# Patient Record
Sex: Female | Born: 1985 | Race: Black or African American | Hispanic: No | Marital: Single | Smoking: Never smoker
Health system: Southern US, Community
[De-identification: ages and names within clinical notes are randomized; demographics above are authoritative.]

---

## 2010-12-19 ENCOUNTER — Emergency Department (HOSPITAL_BASED_OUTPATIENT_CLINIC_OR_DEPARTMENT_OTHER)
Admission: EM | Admit: 2010-12-19 | Discharge: 2010-12-20 | Payer: Self-pay | Source: Home / Self Care | Admitting: Emergency Medicine

## 2010-12-23 LAB — URINALYSIS, ROUTINE W REFLEX MICROSCOPIC
Bilirubin Urine: NEGATIVE
Hgb urine dipstick: NEGATIVE
Ketones, ur: NEGATIVE mg/dL
Nitrite: NEGATIVE
Protein, ur: NEGATIVE mg/dL
Specific Gravity, Urine: 1.036 — ABNORMAL HIGH (ref 1.005–1.030)
Urine Glucose, Fasting: NEGATIVE mg/dL
Urobilinogen, UA: 1 mg/dL (ref 0.0–1.0)
pH: 6 (ref 5.0–8.0)

## 2010-12-23 LAB — WET PREP, GENITAL: Trich, Wet Prep: NONE SEEN

## 2010-12-23 LAB — PREGNANCY, URINE: Preg Test, Ur: NEGATIVE

## 2010-12-23 LAB — GC/CHLAMYDIA PROBE AMP, GENITAL
Chlamydia, DNA Probe: NEGATIVE
GC Probe Amp, Genital: NEGATIVE

## 2012-04-17 ENCOUNTER — Emergency Department (HOSPITAL_BASED_OUTPATIENT_CLINIC_OR_DEPARTMENT_OTHER)
Admission: EM | Admit: 2012-04-17 | Discharge: 2012-04-17 | Disposition: A | Discharge status: Home / Self Care | Payer: Self-pay | Attending: Emergency Medicine | Admitting: Emergency Medicine

## 2012-04-17 ENCOUNTER — Encounter (HOSPITAL_BASED_OUTPATIENT_CLINIC_OR_DEPARTMENT_OTHER): Payer: Self-pay | Admitting: *Deleted

## 2012-04-17 DIAGNOSIS — L298 Other pruritus: Secondary | ICD-10-CM | POA: Insufficient documentation

## 2012-04-17 DIAGNOSIS — L509 Urticaria, unspecified: Secondary | ICD-10-CM | POA: Insufficient documentation

## 2012-04-17 DIAGNOSIS — R21 Rash and other nonspecific skin eruption: Secondary | ICD-10-CM | POA: Insufficient documentation

## 2012-04-17 DIAGNOSIS — W57XXXA Bitten or stung by nonvenomous insect and other nonvenomous arthropods, initial encounter: Secondary | ICD-10-CM | POA: Insufficient documentation

## 2012-04-17 DIAGNOSIS — T148 Other injury of unspecified body region: Secondary | ICD-10-CM | POA: Insufficient documentation

## 2012-04-17 DIAGNOSIS — L2989 Other pruritus: Secondary | ICD-10-CM | POA: Insufficient documentation

## 2012-04-17 MED ORDER — PREDNISONE 20 MG PO TABS: 40.0000 mg | ORAL_TABLET | Freq: Every day | ORAL | Status: AC

## 2012-04-17 MED ORDER — PREDNISONE 50 MG PO TABS
60.0000 mg | ORAL_TABLET | Freq: Once | ORAL | Status: AC
  Administered 2012-04-17: 60 mg via ORAL
  Filled 2012-04-17: qty 1

## 2012-04-17 NOTE — Discharge Instructions (Signed)
Bedbugs  Bedbugs are tiny bugs that live in and around beds. They come out at night and bite people lying in bed. Bedbug bites rarely cause a medical problem. The bites do cause red, itchy bumps.  HOME CARE   Only take medicine as told by your doctor.    Wear pajamas with long sleeves and pant legs.    Call a pest control expert. You may need to throw away your mattress. Ask the pest control expert what you can do to keep the bedbugs from coming back. You may need to:    Put a plastic cover over your mattress.    Wash your clothes and bedding in hot water. Dry them in a hot dryer. The temperature should be hotter than 120 F (48.9 C).    Vacuum all around your bed often.    Check all used furniture, bedding, or clothes for bedbugs before you bring them into your house.    Remove bird nests and bat perches around your home.    After you travel, check your clothes and luggage for bedbugs before you bring them into your house. If you find any bedbugs, throw those items away.   GET HELP RIGHT AWAY IF:   You have a fever.    You have red bug bites that keep coming back.    You have red bug bites that itch badly.    You have bug bites that cause a skin rash.    You have scratch marks that are red and sore.   MAKE SURE YOU:   Understand these instructions.    Will watch your condition.    Will get help right away if you are not doing well or get worse.   Document Released: 03/11/2011 Document Revised: 11/13/2011 Document Reviewed: 03/11/2011  ExitCare Patient Information 2012 [employer].

## 2012-04-17 NOTE — ED Provider Notes (Signed)
History     CSN: [id number]  Arrival date & time 04/17/12  0156   First MD Initiated Contact with Patient 04/17/12 0226      Chief Complaint  Patient presents with  . Rash    (Consider location/radiation/quality/duration/timing/severity/associated sxs/prior treatment) Patient is a 26 y.o. female presenting with rash. The history is provided by the patient.  Rash  This is a new problem. Episode onset: 2 weeks. The problem has been gradually worsening. Associated with: new mattress. There has been no fever. The rash is present on the torso, left arm and right arm. The pain is at a severity of 0/10. The patient is experiencing no pain. The pain has been constant since onset. Associated symptoms include itching. She has tried anti-itch cream for the symptoms. The treatment provided no relief. Risk factors include new environmental exposures.    History reviewed. No pertinent past medical history.  History reviewed. No pertinent past surgical history.  No family history on file.  History  Substance Use Topics  . Smoking status: Never Smoker   . Smokeless tobacco: Not on file  . Alcohol Use: No    OB History    Grav Para Term Preterm Abortions TAB SAB Ect Mult Living                  Review of Systems  Constitutional: Negative for fever.  Skin: Positive for itching and rash.  All other systems reviewed and are negative.    Allergies  Review of patient's allergies indicates no known allergies.  Home Medications  No current outpatient prescriptions on file.  BP 138/94  Pulse 107  Temp(Src) 98 F (36.7 C) (Oral)  Resp 20  SpO2 100%  Physical Exam  Constitutional: She appears well-developed and well-nourished. No distress.  HENT:  Head: Normocephalic and atraumatic.  Eyes: EOM are normal. Pupils are equal, round, and reactive to light.  Cardiovascular: Normal rate, regular rhythm and normal heart sounds.   Pulmonary/Chest: Effort normal and breath sounds normal.  She has no wheezes. She has no rales.  Skin: Skin is warm and dry. Lesion and rash noted. Rash is maculopapular and urticarial. Rash is not pustular and not vesicular.          Excoriated lesions and the urticarial regions in the areas expressed in the picture  Psychiatric: She has a normal mood and affect. Her behavior is normal.    ED Course  Procedures (including critical care time)  Labs Reviewed - No data to display No results found.   1. Bedbug bite   2. Urticaria       MDM   Patient with lesions most consistent with that but bites. She recently got a new mattress flea market which had been used. This is most likely the source of her symptoms. Also tonight she started to itch diffusely and has urticarial lesions. She denies any respiratory involvement and is in no acute distress on exam. Discussed with her ways to Remicade bedbugs. Patient was given prednisone to help with the diffuse pruritus        Gwyneth Sprout, MD 04/17/12 [phone]

## 2012-04-17 NOTE — ED Notes (Signed)
Patient states that she has been itching with raised bumps for the past two weeks. Tonight itching and rash increased. Daughter was treated recently for a fungal infection that started around the same time, and is still on medication.

## 2013-03-21 ENCOUNTER — Encounter (HOSPITAL_BASED_OUTPATIENT_CLINIC_OR_DEPARTMENT_OTHER): Payer: Self-pay

## 2013-03-21 ENCOUNTER — Emergency Department (HOSPITAL_BASED_OUTPATIENT_CLINIC_OR_DEPARTMENT_OTHER)
Admission: EM | Admit: 2013-03-21 | Discharge: 2013-03-21 | Disposition: A | Discharge status: Home / Self Care | Payer: Self-pay | Attending: Emergency Medicine | Admitting: Emergency Medicine

## 2013-03-21 DIAGNOSIS — N898 Other specified noninflammatory disorders of vagina: Secondary | ICD-10-CM | POA: Insufficient documentation

## 2013-03-21 DIAGNOSIS — R51 Headache: Secondary | ICD-10-CM | POA: Insufficient documentation

## 2013-03-21 DIAGNOSIS — R6884 Jaw pain: Secondary | ICD-10-CM | POA: Insufficient documentation

## 2013-03-21 DIAGNOSIS — K089 Disorder of teeth and supporting structures, unspecified: Secondary | ICD-10-CM | POA: Insufficient documentation

## 2013-03-21 DIAGNOSIS — B9689 Other specified bacterial agents as the cause of diseases classified elsewhere: Secondary | ICD-10-CM | POA: Insufficient documentation

## 2013-03-21 DIAGNOSIS — Z3202 Encounter for pregnancy test, result negative: Secondary | ICD-10-CM | POA: Insufficient documentation

## 2013-03-21 DIAGNOSIS — N760 Acute vaginitis: Secondary | ICD-10-CM | POA: Insufficient documentation

## 2013-03-21 DIAGNOSIS — A499 Bacterial infection, unspecified: Secondary | ICD-10-CM | POA: Insufficient documentation

## 2013-03-21 DIAGNOSIS — K0889 Other specified disorders of teeth and supporting structures: Secondary | ICD-10-CM

## 2013-03-21 LAB — URINALYSIS, ROUTINE W REFLEX MICROSCOPIC
Bilirubin Urine: NEGATIVE
Glucose, UA: NEGATIVE mg/dL
Hgb urine dipstick: NEGATIVE
Ketones, ur: NEGATIVE mg/dL
Nitrite: NEGATIVE
Protein, ur: NEGATIVE mg/dL
Specific Gravity, Urine: 1.029 (ref 1.005–1.030)
Urobilinogen, UA: 0.2 mg/dL (ref 0.0–1.0)
pH: 6 (ref 5.0–8.0)

## 2013-03-21 LAB — WET PREP, GENITAL
Trich, Wet Prep: NONE SEEN
Yeast Wet Prep HPF POC: NONE SEEN

## 2013-03-21 LAB — URINE MICROSCOPIC-ADD ON

## 2013-03-21 LAB — PREGNANCY, URINE: Preg Test, Ur: NEGATIVE

## 2013-03-21 MED ORDER — HYDROCODONE-ACETAMINOPHEN 5-325 MG PO TABS: 2.0000 | ORAL_TABLET | ORAL | Status: DC | PRN

## 2013-03-21 MED ORDER — METRONIDAZOLE 500 MG PO TABS: 500.0000 mg | ORAL_TABLET | Freq: Two times a day (BID) | ORAL | Status: DC

## 2013-03-21 MED ORDER — OXYCODONE-ACETAMINOPHEN 5-325 MG PO TABS
2.0000 | ORAL_TABLET | Freq: Once | ORAL | Status: DC
  Filled 2013-03-21 (×2): qty 2

## 2013-03-21 MED ORDER — AZITHROMYCIN 250 MG PO TABS
1000.0000 mg | ORAL_TABLET | Freq: Once | ORAL | Status: AC
  Administered 2013-03-21: 1000 mg via ORAL
  Filled 2013-03-21: qty 4

## 2013-03-21 MED ORDER — PENICILLIN V POTASSIUM 500 MG PO TABS: 500.0000 mg | ORAL_TABLET | Freq: Three times a day (TID) | ORAL | Status: DC

## 2013-03-21 MED ORDER — LIDOCAINE HCL (PF) 1 % IJ SOLN
INTRAMUSCULAR | Status: AC
  Filled 2013-03-21: qty 5

## 2013-03-21 MED ORDER — CEFTRIAXONE SODIUM 250 MG IJ SOLR
250.0000 mg | Freq: Once | INTRAMUSCULAR | Status: AC
  Administered 2013-03-21: 250 mg via INTRAMUSCULAR
  Filled 2013-03-21: qty 250

## 2013-03-21 MED ORDER — ACETAMINOPHEN 500 MG PO TABS
1000.0000 mg | ORAL_TABLET | Freq: Once | ORAL | Status: DC
  Filled 2013-03-21: qty 2

## 2013-03-21 NOTE — ED Provider Notes (Signed)
History     CSN: [id number]  Arrival date & time 03/21/13  1722   First MD Initiated Contact with Patient 03/21/13 1737      Chief Complaint  Patient presents with  . Dental Pain  . Headache  . Vaginal Discharge    (Consider location/radiation/quality/duration/timing/severity/associated sxs/prior treatment) HPI Comments: The patient is a 27 year old otherwise healthy female who presents with dental pain that started gradually one week ago. The dental pain is severe, constant and progressively worsening. The pain is aching and located in right lower jaw. The pain does not radiate. Eating makes the pain worse. Nothing makes the pain better. The patient has tried tylenol for pain with some relief. Patient reports associated headache. Patient denies neck pain/stiffness, fever, NVD, edema, sore throat, throat swelling, wheezing, SOB, chest pain, abdominal pain.   Patient reports vaginal discharged for one week also. No associated symptoms. Symptoms started gradually and progressively worsened since the onset. No aggravating/alleviating factors.   Patient is a 27 y.o. female presenting with tooth pain, headaches, and vaginal discharge.  Dental PainThe primary symptoms include headaches.   Headache Vaginal Discharge Associated symptoms include headaches.    History reviewed. No pertinent past medical history.  History reviewed. No pertinent past surgical history.  No family history on file.  History  Substance Use Topics  . Smoking status: Never Smoker   . Smokeless tobacco: Not on file  . Alcohol Use: Yes     Comment: occasionally    OB History   Grav Para Term Preterm Abortions TAB SAB Ect Mult Living                  Review of Systems  HENT: Positive for dental problem.   Genitourinary: Positive for vaginal discharge.  Neurological: Positive for headaches.  All other systems reviewed and are negative.    Allergies  Review of patient's allergies indicates no known  allergies.  Home Medications  No current outpatient prescriptions on file.  BP 131/88  Pulse 91  Temp(Src) 99.4 F (37.4 C) (Oral)  Resp 20  Wt 217 lb (98.431 kg)  SpO2 100%  Physical Exam  Nursing note and vitals reviewed. Constitutional: She is oriented to person, place, and time. She appears well-developed and well-nourished. No distress.  HENT:  Head: Normocephalic and atraumatic.  Eyes: Conjunctivae are normal.  Neck: Normal range of motion.  Cardiovascular: Normal rate and regular rhythm.  Exam reveals no gallop and no friction rub.   No murmur heard. Pulmonary/Chest: Effort normal and breath sounds normal. She has no wheezes. She has no rales. She exhibits no tenderness.  Abdominal: Soft. She exhibits no distension. There is no tenderness. There is no rebound and no guarding.  Genitourinary:  Copious, white thick vaginal discharge. Cervical os closed. No CMT.   Musculoskeletal: Normal range of motion.  Neurological: She is alert and oriented to person, place, and time. Coordination normal.  Speech is goal-oriented. Moves limbs without ataxia.   Skin: Skin is warm and dry.  Psychiatric: She has a normal mood and affect. Her behavior is normal.    ED Course  Procedures (including critical care time)  Labs Reviewed  WET PREP, GENITAL - Abnormal; Notable for the following:    Clue Cells Wet Prep HPF POC MANY (*)    WBC, Wet Prep HPF POC MODERATE (*)    All other components within normal limits  URINALYSIS, ROUTINE W REFLEX MICROSCOPIC - Abnormal; Notable for the following:    APPearance  CLOUDY (*)    Leukocytes, UA TRACE (*)    All other components within normal limits  URINE MICROSCOPIC-ADD ON - Abnormal; Notable for the following:    Squamous Epithelial / LPF FEW (*)    Bacteria, UA FEW (*)    All other components within normal limits  URINE CULTURE  GC/CHLAMYDIA PROBE AMP  PREGNANCY, URINE   No results found.   1. BV (bacterial vaginosis)   2. Pain,  dental       MDM  6:58 PM Urinalysis and urine pregnancy unremarkable. Wet prep shows moderate WBC and many clue cells. I will treat her for BV and GC/Chlamydia. Patient afebrile with stable vitals. Patient will be contacted with positive GC/Chlamydia results within 48 hours if positive. Patient will have vicodin for dental pain and pen VK for possible infection. Patient instructed to follow up with dentist-she shoulder call within 48 hours to guarantee a visit, as explained to the patient.        Emilia Beck, [place] 03/21/13 1912

## 2013-03-21 NOTE — ED Provider Notes (Signed)
Medical screening examination/treatment/procedure(s) were performed by non-physician practitioner and as supervising physician I was immediately available for consultation/collaboration.   Charles B. Sheldon, MD 03/21/13 2003 

## 2013-03-21 NOTE — ED Notes (Addendum)
Per patient, she states that a week ago she started having dental pain on right side, upper teeth and now has a severe headache from the pain. Pt states she took tylenol for the pain with some relief. Hx of cavities.

## 2013-03-21 NOTE — ED Notes (Signed)
Pt states she has had nausea for one week which is associated with vaginal discharge.

## 2013-03-22 LAB — URINE CULTURE: Special Requests: NORMAL

## 2013-03-22 LAB — GC/CHLAMYDIA PROBE AMP
CT Probe RNA: NEGATIVE
GC Probe RNA: NEGATIVE

## 2014-11-01 ENCOUNTER — Emergency Department (HOSPITAL_BASED_OUTPATIENT_CLINIC_OR_DEPARTMENT_OTHER)
Admission: EM | Admit: 2014-11-01 | Discharge: 2014-11-01 | Disposition: A | Discharge status: Home / Self Care | Payer: Self-pay | Attending: Emergency Medicine | Admitting: Emergency Medicine

## 2014-11-01 ENCOUNTER — Encounter (HOSPITAL_BASED_OUTPATIENT_CLINIC_OR_DEPARTMENT_OTHER): Payer: Self-pay | Admitting: Emergency Medicine

## 2014-11-01 DIAGNOSIS — Z3202 Encounter for pregnancy test, result negative: Secondary | ICD-10-CM | POA: Insufficient documentation

## 2014-11-01 DIAGNOSIS — Z792 Long term (current) use of antibiotics: Secondary | ICD-10-CM | POA: Insufficient documentation

## 2014-11-01 DIAGNOSIS — B373 Candidiasis of vulva and vagina: Secondary | ICD-10-CM | POA: Insufficient documentation

## 2014-11-01 DIAGNOSIS — Z79899 Other long term (current) drug therapy: Secondary | ICD-10-CM | POA: Insufficient documentation

## 2014-11-01 DIAGNOSIS — B3731 Acute candidiasis of vulva and vagina: Secondary | ICD-10-CM

## 2014-11-01 LAB — URINALYSIS, ROUTINE W REFLEX MICROSCOPIC
Bilirubin Urine: NEGATIVE
Glucose, UA: NEGATIVE mg/dL
Hgb urine dipstick: NEGATIVE
Ketones, ur: NEGATIVE mg/dL
Leukocytes, UA: NEGATIVE
Nitrite: NEGATIVE
Protein, ur: NEGATIVE mg/dL
Specific Gravity, Urine: 1.035 — ABNORMAL HIGH (ref 1.005–1.030)
Urobilinogen, UA: 0.2 mg/dL (ref 0.0–1.0)
pH: 5.5 (ref 5.0–8.0)

## 2014-11-01 LAB — WET PREP, GENITAL: Trich, Wet Prep: NONE SEEN

## 2014-11-01 LAB — PREGNANCY, URINE: Preg Test, Ur: NEGATIVE

## 2014-11-01 MED ORDER — FLUCONAZOLE 150 MG PO TABS: 150.0000 mg | ORAL_TABLET | Freq: Every day | ORAL | Status: DC

## 2014-11-01 NOTE — Discharge Instructions (Signed)
Diflucan as prescribed.  Return to the emergency department if you develop severe abdominal pain, high fever, or other new and concerning symptoms.  We will call you if your cultures indicate that you require additional treatment.   Vaginitis Vaginitis is an inflammation of the vagina. It is most often caused by a change in the normal balance of the bacteria and yeast that live in the vagina. This change in balance causes an overgrowth of certain bacteria or yeast, which causes the inflammation. There are different types of vaginitis, but the most common types are:  Bacterial vaginosis.  Yeast infection (candidiasis).  Trichomoniasis vaginitis. This is a sexually transmitted infection (STI).  Viral vaginitis.  Atropic vaginitis.  Allergic vaginitis. CAUSES  The cause depends on the type of vaginitis. Vaginitis can be caused by:  Bacteria (bacterial vaginosis).  Yeast (yeast infection).  A parasite (trichomoniasis vaginitis)  A virus (viral vaginitis).  Low hormone levels (atrophic vaginitis). Low hormone levels can occur during pregnancy, breastfeeding, or after menopause.  Irritants, such as bubble baths, scented tampons, and feminine sprays (allergic vaginitis). Other factors can change the normal balance of the yeast and bacteria that live in the vagina. These include:  Antibiotic medicines.  Poor hygiene.  Diaphragms, vaginal sponges, spermicides, birth control pills, and intrauterine devices (IUD).  Sexual intercourse.  Infection.  Uncontrolled diabetes.  A weakened immune system. SYMPTOMS  Symptoms can vary depending on the cause of the vaginitis. Common symptoms include:  Abnormal vaginal discharge.  The discharge is white, gray, or yellow with bacterial vaginosis.  The discharge is thick, white, and cheesy with a yeast infection.  The discharge is frothy and yellow or greenish with trichomoniasis.  A bad vaginal odor.  The odor is fishy with  bacterial vaginosis.  Vaginal itching, pain, or swelling.  Painful intercourse.  Pain or burning when urinating. Sometimes, there are no symptoms. TREATMENT  Treatment will vary depending on the type of infection.   Bacterial vaginosis and trichomoniasis are often treated with antibiotic creams or pills.  Yeast infections are often treated with antifungal medicines, such as vaginal creams or suppositories.  Viral vaginitis has no cure, but symptoms can be treated with medicines that relieve discomfort. Your sexual partner should be treated as well.  Atrophic vaginitis may be treated with an estrogen cream, pill, suppository, or vaginal ring. If vaginal dryness occurs, lubricants and moisturizing creams may help. You may be told to avoid scented soaps, sprays, or douches.  Allergic vaginitis treatment involves quitting the use of the product that is causing the problem. Vaginal creams can be used to treat the symptoms. HOME CARE INSTRUCTIONS   Take all medicines as directed by your caregiver.  Keep your genital area clean and dry. Avoid soap and only rinse the area with water.  Avoid douching. It can remove the healthy bacteria in the vagina.  Do not use tampons or have sexual intercourse until your vaginitis has been treated. Use sanitary pads while you have vaginitis.  Wipe from front to back. This avoids the spread of bacteria from the rectum to the vagina.  Let air reach your genital area.  Wear cotton underwear to decrease moisture buildup.  Avoid wearing underwear while you sleep until your vaginitis is gone.  Avoid tight pants and underwear or nylons without a cotton panel.  Take off wet clothing (especially bathing suits) as soon as possible.  Use mild, non-scented products. Avoid using irritants, such as:  Scented feminine sprays.  Fabric softeners.  Scented  detergents.  Scented tampons.  Scented soaps or bubble baths.  Practice safe sex and use condoms.  Condoms may prevent the spread of trichomoniasis and viral vaginitis. SEEK MEDICAL CARE IF:   You have abdominal pain.  You have a fever or persistent symptoms for more than 2-3 days.  You have a fever and your symptoms suddenly get worse. Document Released: 09/21/2007 Document Revised: 08/18/2012 Document Reviewed: 05/06/2012 [facility] Patient Information 2015 [place]Care, [place]. This information is not intended to replace advice given to you by your health care provider. Make sure you discuss any questions you have with your health care provider.

## 2014-11-01 NOTE — ED Notes (Signed)
Pt reports that she developed some lower abdominal cramping with no bleeding, started to notice discharge and fowl odor on Sunday that is getting progressively worse

## 2014-11-01 NOTE — ED Provider Notes (Signed)
CSN: [id number]     Arrival date & time 11/01/14  0147 History   First MD Initiated Contact with Patient 11/01/14 0207     Chief Complaint  Patient presents with  . Vaginal Discharge     (Consider location/radiation/quality/duration/timing/severity/associated sxs/prior Treatment) HPI Comments: Patient is a 28 year old female who presents with complaints of lower abdominal cramping and vaginal discharge for the past week. She states it is white and has a foul odor. She denies any bleeding. She denies any new sexual contacts. Her last menstrual period was 3 weeks ago and normal.  Patient is a 28 y.o. female presenting with vaginal discharge. The history is provided by the patient.  Vaginal Discharge Quality:  White Severity:  Moderate Onset quality:  Gradual Duration:  1 week Timing:  Constant Progression:  Worsening Chronicity:  New Relieved by:  Nothing Worsened by:  Nothing tried Ineffective treatments:  None tried Associated symptoms: abdominal pain   Associated symptoms: no dyspareunia, no nausea and no urinary frequency   Risk factors: no new sexual partner     History reviewed. No pertinent past medical history. History reviewed. No pertinent past surgical history. History reviewed. No pertinent family history. History  Substance Use Topics  . Smoking status: Never Smoker   . Smokeless tobacco: Not on file  . Alcohol Use: Yes     Comment: occasionally   OB History    No data available     Review of Systems  Gastrointestinal: Positive for abdominal pain. Negative for nausea.  Genitourinary: Positive for vaginal discharge. Negative for dyspareunia.  All other systems reviewed and are negative.     Allergies  Review of patient's allergies indicates no known allergies.  Home Medications   Prior to Admission medications   Medication Sig Start Date End Date Taking? Authorizing Provider  HYDROcodone-acetaminophen (NORCO/VICODIN) 5-325 MG per tablet Take 2  tablets by mouth every 4 (four) hours as needed for pain. 03/21/13   Kaitlyn Szekalski, PA-C  metroNIDAZOLE (FLAGYL) 500 MG tablet Take 1 tablet (500 mg total) by mouth 2 (two) times daily. 03/21/13   Kaitlyn Szekalski, PA-C  penicillin v potassium (VEETID) 500 MG tablet Take 1 tablet (500 mg total) by mouth 3 (three) times daily. 03/21/13   Kaitlyn Szekalski, PA-C   BP 147/95 mmHg  Pulse 106  Temp(Src) 98.6 F (37 C) (Oral)  Resp 18  Ht 5\' 8"  (1.727 m)  Wt 220 lb (99.791 kg)  BMI 33.46 kg/m2  SpO2 100%  LMP 10/06/2014 Physical Exam  Constitutional: She is oriented to person, place, and time. She appears well-developed and well-nourished. No distress.  HENT:  Head: Normocephalic and atraumatic.  Neck: Normal range of motion. Neck supple.  Abdominal: Soft. Bowel sounds are normal. She exhibits no distension. There is tenderness.  There is mild tenderness to palpation in the suprapubic region with no rebound or guarding.  Genitourinary: Vaginal discharge found.  Areas a slight white vaginal discharge present. There is no adnexal tenderness or masses. There is no cervical motion tenderness.  Musculoskeletal: Normal range of motion.  Neurological: She is alert and oriented to person, place, and time.  Skin: Skin is warm and dry. She is not diaphoretic.  Nursing note and vitals reviewed.   ED Course  Procedures (including critical care time) Labs Review Labs Reviewed  URINALYSIS, ROUTINE W REFLEX MICROSCOPIC - Abnormal; Notable for the following:    APPearance CLOUDY (*)    Specific Gravity, Urine 1.035 (*)    All other components within  normal limits  WET PREP, GENITAL  GC/CHLAMYDIA PROBE AMP  PREGNANCY, URINE    Imaging Review No results found.   EKG Interpretation None      MDM   Final diagnoses:  None    Patient presents with complaints of vaginal discharge. Workup reveals a clear urinalysis and negative pregnancy test. Wet prep reveals many yeast. She will be  treated with Diflucan. GC and Chlamydia cultures are pending. If these become positive, she will be notified and treated accordingly.    Geoffery Lyonsouglas Jejuan Scala, MD 11/01/14 [phone]

## 2014-11-02 LAB — GC/CHLAMYDIA PROBE AMP
CT Probe RNA: NEGATIVE
GC Probe RNA: NEGATIVE

## 2015-02-01 ENCOUNTER — Encounter (HOSPITAL_BASED_OUTPATIENT_CLINIC_OR_DEPARTMENT_OTHER): Payer: Self-pay | Admitting: Emergency Medicine

## 2015-02-01 ENCOUNTER — Emergency Department (HOSPITAL_BASED_OUTPATIENT_CLINIC_OR_DEPARTMENT_OTHER): Payer: Self-pay

## 2015-02-01 ENCOUNTER — Emergency Department (HOSPITAL_BASED_OUTPATIENT_CLINIC_OR_DEPARTMENT_OTHER)
Admission: EM | Admit: 2015-02-01 | Discharge: 2015-02-02 | Disposition: A | Discharge status: Home / Self Care | Payer: Self-pay | Attending: Emergency Medicine | Admitting: Emergency Medicine

## 2015-02-01 DIAGNOSIS — R0609 Other forms of dyspnea: Secondary | ICD-10-CM | POA: Insufficient documentation

## 2015-02-01 DIAGNOSIS — Z792 Long term (current) use of antibiotics: Secondary | ICD-10-CM | POA: Insufficient documentation

## 2015-02-01 DIAGNOSIS — Z79899 Other long term (current) drug therapy: Secondary | ICD-10-CM | POA: Insufficient documentation

## 2015-02-01 DIAGNOSIS — Z3202 Encounter for pregnancy test, result negative: Secondary | ICD-10-CM | POA: Insufficient documentation

## 2015-02-01 NOTE — ED Notes (Signed)
Patient is talking on her cell phone in the waiting room without an distress noted. Patient ambulated to triage room and to the waiting room without any distress.

## 2015-02-01 NOTE — ED Notes (Signed)
Patient states that she has not been breathing normal for the last week "I am just not getting enough air"

## 2015-02-02 LAB — CBC WITH DIFFERENTIAL/PLATELET
Basophils Absolute: 0 10*3/uL (ref 0.0–0.1)
Basophils Relative: 0 % (ref 0–1)
Eosinophils Absolute: 0.1 10*3/uL (ref 0.0–0.7)
Eosinophils Relative: 2 % (ref 0–5)
HCT: 37.6 % (ref 36.0–46.0)
Hemoglobin: 12.4 g/dL (ref 12.0–15.0)
Lymphocytes Relative: 42 % (ref 12–46)
Lymphs Abs: 4.1 10*3/uL — ABNORMAL HIGH (ref 0.7–4.0)
MCH: 27.1 pg (ref 26.0–34.0)
MCHC: 33 g/dL (ref 30.0–36.0)
MCV: 82.3 fL (ref 78.0–100.0)
Monocytes Absolute: 0.7 10*3/uL (ref 0.1–1.0)
Monocytes Relative: 7 % (ref 3–12)
Neutro Abs: 4.7 10*3/uL (ref 1.7–7.7)
Neutrophils Relative %: 49 % (ref 43–77)
Platelets: 323 10*3/uL (ref 150–400)
RBC: 4.57 MIL/uL (ref 3.87–5.11)
RDW: 14.9 % (ref 11.5–15.5)
WBC: 9.6 10*3/uL (ref 4.0–10.5)

## 2015-02-02 LAB — D-DIMER, QUANTITATIVE: D-Dimer, Quant: <0.27 ug/mL-FEU (ref 0.00–0.48)

## 2015-02-02 LAB — BASIC METABOLIC PANEL
Anion gap: 4 — ABNORMAL LOW (ref 5–15)
BUN: 11 mg/dL (ref 6–23)
CO2: 25 mmol/L (ref 19–32)
Calcium: 8.7 mg/dL (ref 8.4–10.5)
Chloride: 104 mmol/L (ref 96–112)
Creatinine, Ser: 0.72 mg/dL (ref 0.50–1.10)
GFR calc Af Amer: >90 mL/min (ref >90)
GFR calc non Af Amer: >90 mL/min (ref >90)
Glucose, Bld: 93 mg/dL (ref 70–99)
Potassium: 3.7 mmol/L (ref 3.5–5.1)
Sodium: 133 mmol/L — ABNORMAL LOW (ref 135–145)

## 2015-02-02 LAB — PREGNANCY, URINE: PREG TEST UR: NEGATIVE

## 2015-02-02 MED ORDER — SODIUM CHLORIDE 0.9 % IV SOLN
Freq: Once | INTRAVENOUS | Status: AC
  Administered 2015-02-02: 03:00:00 via INTRAVENOUS

## 2015-02-02 NOTE — ED Notes (Signed)
IV flds changed to kvo

## 2015-02-02 NOTE — Discharge Instructions (Signed)
We saw you in the ER for the chest pain/shortness of breath. °All of our cardiac workup is normal, including labs, EKG and chest X-RAY are normal. °We are not sure what is causing your discomfort, but we feel comfortable sending you home at this time. The workup in the ER is not complete, and you should follow up with your primary care doctor for further evaluation. ° ° °Shortness of Breath °Shortness of breath means you have trouble breathing. It could also mean that you have a medical problem. You should get immediate medical care for shortness of breath. °CAUSES  °· Not enough oxygen in the air such as with high altitudes or a smoke-filled room. °· Certain lung diseases, infections, or problems. °· Heart disease or conditions, such as angina or heart failure. °· Low red blood cells (anemia). °· Poor physical fitness, which can cause shortness of breath when you exercise. °· Chest or back injuries or stiffness. °· Being overweight. °· Smoking. °· Anxiety, which can make you feel like you are not getting enough air. °DIAGNOSIS  °Serious medical problems can often be found during your physical exam. Tests may also be done to determine why you are having shortness of breath. Tests may include: °· Chest X-rays. °· Lung function tests. °· Blood tests. °· An electrocardiogram (ECG). °· An ambulatory electrocardiogram. An ambulatory ECG records your heartbeat patterns over a 24-hour period. °· Exercise testing. °· A transthoracic echocardiogram (TTE). During echocardiography, sound waves are used to evaluate how blood flows through your heart. °· A transesophageal echocardiogram (TEE). °· Imaging scans. °Your health care provider may not be able to find a cause for your shortness of breath after your exam. In this case, it is important to have a follow-up exam with your health care provider as directed.  °TREATMENT  °Treatment for shortness of breath depends on the cause of your symptoms and can vary greatly. °HOME CARE  INSTRUCTIONS  °· Do not smoke. Smoking is a common cause of shortness of breath. If you smoke, ask for help to quit. °· Avoid being around chemicals or things that may bother your breathing, such as paint fumes and dust. °· Rest as needed. Slowly resume your usual activities. °· If medicines were prescribed, take them as directed for the full length of time directed. This includes oxygen and any inhaled medicines. °· Keep all follow-up appointments as directed by your health care provider. °SEEK MEDICAL CARE IF:  °· Your condition does not improve in the time expected. °· You have a hard time doing your normal activities even with rest. °· You have any new symptoms. °SEEK IMMEDIATE MEDICAL CARE IF:  °· Your shortness of breath gets worse. °· You feel light-headed, faint, or develop a cough not controlled with medicines. °· You start coughing up blood. °· You have pain with breathing. °· You have chest pain or pain in your arms, shoulders, or abdomen. °· You have a fever. °· You are unable to walk up stairs or exercise the way you normally do. °MAKE SURE YOU: °· Understand these instructions. °· Will watch your condition. °· Will get help right away if you are not doing well or get worse. °Document Released: 08/19/2001 Document Revised: 11/29/2013 Document Reviewed: 02/09/2012 °Exit[employer]® Patient Information ©2015 [employer]. This information is not intended to replace advice given to you by your health care provider. Make sure you discuss any questions you have with your health care provider. ° °

## 2015-02-02 NOTE — ED Provider Notes (Signed)
CSN: [id number]     Arrival date & time 02/01/15  2203 History   First MD Initiated Contact with Patient 02/02/15 0154     Chief Complaint  Patient presents with  . Shortness of Breath     (Consider location/radiation/quality/duration/timing/severity/associated sxs/prior Treatment) HPI Comments: Pt with no medical hx comes in with cc of shortness of breath. Pt has DIB with exertion x 1 week. Pt's symptoms are present when she walks from her parking lot to the work place. She doesn't get similat symptoms with minimal walking. No chest pain. Pt feels that she has trouble getting air in. No productive cough, no wheezing and no hx of lung disease.  Pt has no hx of PE, DVT and denies any exogenous estrogen use, long distance travels or surgery in the past 6 weeks, active cancer, recent immobilization.     Patient is a 29 y.o. female presenting with shortness of breath. The history is provided by the patient.  Shortness of Breath Associated symptoms: no abdominal pain, no chest pain, no headaches, no neck pain and no vomiting     History reviewed. No pertinent past medical history. History reviewed. No pertinent past surgical history. History reviewed. No pertinent family history. History  Substance Use Topics  . Smoking status: Never Smoker   . Smokeless tobacco: Not on file  . Alcohol Use: Yes     Comment: occasionally   OB History    No data available     Review of Systems  Constitutional: Positive for activity change.  Respiratory: Positive for shortness of breath.   Cardiovascular: Negative for chest pain.  Gastrointestinal: Negative for nausea, vomiting and abdominal pain.  Genitourinary: Negative for dysuria.  Musculoskeletal: Negative for neck pain.  Neurological: Negative for headaches.      Allergies  Review of patient's allergies indicates no known allergies.  Home Medications   Prior to Admission medications   Medication Sig Start Date End Date Taking?  Authorizing Provider  fluconazole (DIFLUCAN) 150 MG tablet Take 1 tablet (150 mg total) by mouth daily. 11/01/14   Geoffery Lyons, MD  HYDROcodone-acetaminophen (NORCO/VICODIN) 5-325 MG per tablet Take 2 tablets by mouth every 4 (four) hours as needed for pain. 03/21/13   Kaitlyn Szekalski, PA-C  metroNIDAZOLE (FLAGYL) 500 MG tablet Take 1 tablet (500 mg total) by mouth 2 (two) times daily. 03/21/13   Kaitlyn Szekalski, PA-C  penicillin v potassium (VEETID) 500 MG tablet Take 1 tablet (500 mg total) by mouth 3 (three) times daily. 03/21/13   Kaitlyn Szekalski, PA-C   BP 147/93 mmHg  Pulse 87  Temp(Src) 97.9 F (36.6 C) (Oral)  Resp 20  Wt 245 lb (111.131 kg)  SpO2 100%  LMP 01/07/2015 Physical Exam  Constitutional: She is oriented to person, place, and time. She appears well-developed and well-nourished.  HENT:  Head: Normocephalic and atraumatic.  Eyes: EOM are normal. Pupils are equal, round, and reactive to light.  Neck: Neck supple.  Cardiovascular: Normal rate, regular rhythm and normal heart sounds.   No murmur heard. Pulmonary/Chest: Effort normal. No respiratory distress. She has no rales.  Abdominal: Soft. She exhibits no distension. There is no tenderness. There is no rebound and no guarding.  Musculoskeletal: She exhibits no edema or tenderness.  Neurological: She is alert and oriented to person, place, and time.  Skin: Skin is warm and dry.  Nursing note and vitals reviewed.   ED Course  Procedures (including critical care time) Labs Review Labs Reviewed  CBC WITH DIFFERENTIAL/PLATELET -  Abnormal; Notable for the following:    Lymphs Abs 4.1 (*)    All other components within normal limits  BASIC METABOLIC PANEL - Abnormal; Notable for the following:    Sodium 133 (*)    Anion gap 4 (*)    All other components within normal limits  D-DIMER, QUANTITATIVE  PREGNANCY, URINE    Imaging Review Dg Chest 2 View  02/01/2015   CLINICAL DATA:  Shortness of breath for 1  week.  Worse on exertion.  EXAM: CHEST  2 VIEW  COMPARISON:  None.  FINDINGS: The heart size and mediastinal contours are within normal limits. Both lungs are clear. The visualized skeletal structures are unremarkable.  IMPRESSION: No active cardiopulmonary disease.   Electronically Signed   By: Burman NievesWilliam  Stevens M.D.   On: 02/01/2015 22:38     EKG Interpretation None      MDM   Final diagnoses:  DOE (dyspnea on exertion)    PT comes in with cc of dyspnea. She is PERC negative. CXR is clear. No signs of DVT either, however, we can't account for her symptoms, and therefore dimer was still ordered and is neg. [facility] f/u provided. Strict return precautions discussed.   Derwood KaplanAnkit Chaka Boyson, MD 02/02/15 0400

## 2015-04-27 ENCOUNTER — Ambulatory Visit (INDEPENDENT_AMBULATORY_CARE_PROVIDER_SITE_OTHER): Payer: Self-pay | Admitting: Family Medicine

## 2015-04-27 VITALS — BP 116/76 | HR 92 | Temp 99.1°F | Resp 20 | Ht 68.0 in | Wt 246.4 lb

## 2015-04-27 DIAGNOSIS — Z020 Encounter for examination for admission to educational institution: Secondary | ICD-10-CM

## 2015-04-27 DIAGNOSIS — Z0000 Encounter for general adult medical examination without abnormal findings: Secondary | ICD-10-CM

## 2015-04-27 NOTE — Progress Notes (Signed)
Subjective:    Patient ID: Caroline Harrell, female    DOB: 03/01/1986, 29 y.o.   MRN: [id number] This chart was scribed for Elvina SidleKurt Lauenstein, MD by Chestine SporeSoijett Blue, ED Scribe. The patient was seen in room 10 at 5:26 PM.   Chief Complaint  Patient presents with   Annual Exam    for nursing school    HPI   Caroline Harrell is a 29 y.o. female who presents today complaining of annual exam onset today. 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 She denies any other symptoms.  Pt works with [employer].      There are no active problems to display for this patient.  History reviewed. No pertinent past medical history. History reviewed. No pertinent past surgical history. No Known Allergies Prior to Admission medications   Medication Sig Start Date End Date Taking? Authorizing Provider  fluconazole (DIFLUCAN) 150 MG tablet Take 1 tablet (150 mg total) by mouth daily. Patient not taking: Reported on 04/27/2015 11/01/14   Geoffery Lyonsouglas Delo, MD  HYDROcodone-acetaminophen (NORCO/VICODIN) 5-325 MG per tablet Take 2 tablets by mouth every 4 (four) hours as needed for pain. Patient not taking: Reported on 04/27/2015 03/21/13   Emilia BeckKaitlyn Szekalski, PA-C  metroNIDAZOLE (FLAGYL) 500 MG tablet Take 1 tablet (500 mg total) by mouth 2 (two) times daily. Patient not taking: Reported on 04/27/2015 03/21/13   Emilia BeckKaitlyn Szekalski, PA-C  penicillin v potassium (VEETID) 500 MG tablet Take 1 tablet (500 mg total) by mouth 3 (three) times daily. Patient not taking: Reported on 04/27/2015 03/21/13   Emilia BeckKaitlyn Szekalski, PA-C      Review of Systems  Constitutional: Negative for fever and chills.  HENT: Negative for congestion and ear pain.   Eyes: Negative for pain.  Respiratory: Negative for cough and shortness of breath.   Cardiovascular: Negative for chest pain.  Gastrointestinal: Negative for nausea, vomiting, abdominal pain and diarrhea.    Musculoskeletal: Negative for myalgias, back pain, joint swelling, arthralgias, gait problem and neck pain.  Skin: Negative for rash.  Allergic/Immunologic: Negative for immunocompromised state.  Neurological: Negative for dizziness, syncope, weakness, numbness and headaches.  Hematological: Does not bruise/bleed easily.       Objective:   Physical Exam  Constitutional: She is oriented to person, place, and time. She appears well-developed and well-nourished. No distress.  HENT:  Head: Normocephalic and atraumatic.  Right Ear: Tympanic membrane, external ear and ear canal normal.  Left Ear: Tympanic membrane, external ear and ear canal normal.  Mouth/Throat: Dental caries present.  Tooth number 13 broken with the root showing.  Eyes: Conjunctivae and EOM are normal. Pupils are equal, round, and reactive to light.  Neck: Neck supple. No tracheal deviation present.  Cardiovascular: Normal rate and regular rhythm.  Exam reveals no gallop and no friction rub.   No murmur heard. Pulmonary/Chest: Effort normal and breath sounds normal. No respiratory distress. She has no wheezes. She has no rhonchi. She has no rales.  Abdominal: Soft. There is no tenderness.  Musculoskeletal: Normal range of motion.  Neurological: She is alert and oriented to person, place, and time.  Skin: Skin is warm and dry.  Psychiatric: She has a normal mood and affect. Her behavior is normal.  Nursing note and vitals reviewed.    BP 116/76 mmHg   Pulse 92   Temp(Src) 99.1 F (37.3 C) (Oral)   Resp 20   Ht 5\' 8"  (1.727 m)  Wt 246 lb 6 oz (111.755 kg)   BMI 37.47 kg/m2   SpO2 98%   LMP 04/24/2015  Assessment & Plan:  DIAGNOSTIC STUDIES: Oxygen Saturation is 98% on RA, nl by my interpretation.    COORDINATION OF CARE: 5:34 PM-Discussed treatment plan which includes TB test with pt at bedside and pt agreed to plan.   This chart was scribed in my presence and reviewed by me personally.    ICD-9-CM ICD-10-CM    1. Annual physical exam V70.0 Z00.00     Dental evaluation recommended  Signed, Elvina SidleKurt Lauenstein, MD

## 2018-04-28 ENCOUNTER — Other Ambulatory Visit: Payer: Self-pay

## 2018-04-28 ENCOUNTER — Inpatient Hospital Stay (HOSPITAL_BASED_OUTPATIENT_CLINIC_OR_DEPARTMENT_OTHER)
Admission: EM | Admit: 2018-04-28 | Discharge: 2018-04-30 | DRG: 418 | Disposition: A | Discharge status: Home / Self Care | Payer: Self-pay | Attending: Surgery | Admitting: Surgery

## 2018-04-28 ENCOUNTER — Encounter (HOSPITAL_BASED_OUTPATIENT_CLINIC_OR_DEPARTMENT_OTHER): Payer: Self-pay

## 2018-04-28 ENCOUNTER — Emergency Department (HOSPITAL_BASED_OUTPATIENT_CLINIC_OR_DEPARTMENT_OTHER): Payer: Self-pay

## 2018-04-28 DIAGNOSIS — R1010 Upper abdominal pain, unspecified: Secondary | ICD-10-CM

## 2018-04-28 DIAGNOSIS — K810 Acute cholecystitis: Secondary | ICD-10-CM | POA: Diagnosis present

## 2018-04-28 DIAGNOSIS — Q441 Other congenital malformations of gallbladder: Secondary | ICD-10-CM

## 2018-04-28 DIAGNOSIS — K819 Cholecystitis, unspecified: Secondary | ICD-10-CM | POA: Diagnosis present

## 2018-04-28 DIAGNOSIS — K8000 Calculus of gallbladder with acute cholecystitis without obstruction: Principal | ICD-10-CM | POA: Diagnosis present

## 2018-04-28 DIAGNOSIS — Z79899 Other long term (current) drug therapy: Secondary | ICD-10-CM

## 2018-04-28 DIAGNOSIS — K828 Other specified diseases of gallbladder: Secondary | ICD-10-CM | POA: Diagnosis present

## 2018-04-28 DIAGNOSIS — R112 Nausea with vomiting, unspecified: Secondary | ICD-10-CM

## 2018-04-28 LAB — COMPREHENSIVE METABOLIC PANEL
ALT: 14 U/L (ref 14–54)
ANION GAP: 8 (ref 5–15)
AST: 19 U/L (ref 15–41)
Albumin: 4.1 g/dL (ref 3.5–5.0)
Alkaline Phosphatase: 70 U/L (ref 38–126)
BUN: 6 mg/dL (ref 6–20)
CO2: 24 mmol/L (ref 22–32)
Calcium: 9 mg/dL (ref 8.9–10.3)
Chloride: 104 mmol/L (ref 101–111)
Creatinine, Ser: 0.74 mg/dL (ref 0.44–1.00)
GFR calc Af Amer: >60 mL/min (ref >60)
GFR calc non Af Amer: >60 mL/min (ref >60)
GLUCOSE: 107 mg/dL — AB (ref 65–99)
Potassium: 3.6 mmol/L (ref 3.5–5.1)
SODIUM: 136 mmol/L (ref 135–145)
Total Bilirubin: 0.3 mg/dL (ref 0.3–1.2)
Total Protein: 8 g/dL (ref 6.5–8.1)

## 2018-04-28 LAB — PREGNANCY, URINE: Preg Test, Ur: NEGATIVE

## 2018-04-28 LAB — CBC WITH DIFFERENTIAL/PLATELET
BASOS PCT: 0 %
Basophils Absolute: 0 10*3/uL (ref 0.0–0.1)
Eosinophils Absolute: 0.1 10*3/uL (ref 0.0–0.7)
Eosinophils Relative: 1 %
HCT: 38.1 % (ref 36.0–46.0)
Hemoglobin: 13.3 g/dL (ref 12.0–15.0)
Lymphocytes Relative: 16 %
Lymphs Abs: 1.6 10*3/uL (ref 0.7–4.0)
MCH: 28.4 pg (ref 26.0–34.0)
MCHC: 34.9 g/dL (ref 30.0–36.0)
MCV: 81.2 fL (ref 78.0–100.0)
MONOS PCT: 5 %
Monocytes Absolute: 0.5 10*3/uL (ref 0.1–1.0)
Neutro Abs: 7.4 10*3/uL (ref 1.7–7.7)
Neutrophils Relative %: 78 %
Platelets: 364 10*3/uL (ref 150–400)
RBC: 4.69 MIL/uL (ref 3.87–5.11)
RDW: 14.4 % (ref 11.5–15.5)
WBC: 9.6 10*3/uL (ref 4.0–10.5)

## 2018-04-28 LAB — URINALYSIS, ROUTINE W REFLEX MICROSCOPIC
Bilirubin Urine: NEGATIVE
Glucose, UA: NEGATIVE mg/dL
Ketones, ur: 40 mg/dL — AB
Leukocytes, UA: NEGATIVE
Nitrite: NEGATIVE
PH: 5.5 (ref 5.0–8.0)
PROTEIN: 30 mg/dL — AB
Specific Gravity, Urine: >1.03 — ABNORMAL HIGH (ref 1.005–1.030)

## 2018-04-28 LAB — URINALYSIS, MICROSCOPIC (REFLEX)

## 2018-04-28 LAB — LIPASE, BLOOD: LIPASE: 33 U/L (ref 11–51)

## 2018-04-28 MED ORDER — SODIUM CHLORIDE 0.9 % IV SOLN
2.0000 g | Freq: Once | INTRAVENOUS | Status: AC
  Administered 2018-04-28: 2 g via INTRAVENOUS
  Filled 2018-04-28: qty 20

## 2018-04-28 MED ORDER — DOCUSATE SODIUM 100 MG PO CAPS: 100.0000 mg | ORAL_CAPSULE | Freq: Two times a day (BID) | ORAL | Status: DC

## 2018-04-28 MED ORDER — ENOXAPARIN SODIUM 40 MG/0.4ML ~~LOC~~ SOLN
40.0000 mg | SUBCUTANEOUS | Status: DC
  Administered 2018-04-28: 40 mg via SUBCUTANEOUS
  Filled 2018-04-28: qty 0.4

## 2018-04-28 MED ORDER — FAMOTIDINE IN NACL 20-0.9 MG/50ML-% IV SOLN
20.0000 mg | Freq: Once | INTRAVENOUS | Status: AC
  Administered 2018-04-28: 20 mg via INTRAVENOUS
  Filled 2018-04-28: qty 50

## 2018-04-28 MED ORDER — MORPHINE SULFATE (PF) 2 MG/ML IV SOLN
2.0000 mg | INTRAVENOUS | Status: DC | PRN
  Administered 2018-04-28 (×2): 2 mg via INTRAVENOUS
  Filled 2018-04-28 (×2): qty 1

## 2018-04-28 MED ORDER — SODIUM CHLORIDE 0.9 % IV BOLUS
1000.0000 mL | Freq: Once | INTRAVENOUS | Status: AC
  Administered 2018-04-28: 1000 mL via INTRAVENOUS

## 2018-04-28 MED ORDER — DIPHENHYDRAMINE HCL 25 MG PO CAPS: 25.0000 mg | ORAL_CAPSULE | Freq: Four times a day (QID) | ORAL | Status: DC | PRN

## 2018-04-28 MED ORDER — SODIUM CHLORIDE 0.9 % IV SOLN
2.0000 g | INTRAVENOUS | Status: DC
  Administered 2018-04-29: 2 g via INTRAVENOUS
  Filled 2018-04-28: qty 20
  Filled 2018-04-28: qty 2

## 2018-04-28 MED ORDER — KCL IN DEXTROSE-NACL 20-5-0.45 MEQ/L-%-% IV SOLN
INTRAVENOUS | Status: DC
  Administered 2018-04-28: 17:00:00 via INTRAVENOUS
  Filled 2018-04-28: qty 1000

## 2018-04-28 MED ORDER — MORPHINE SULFATE (PF) 4 MG/ML IV SOLN
4.0000 mg | Freq: Once | INTRAVENOUS | Status: AC
  Administered 2018-04-28: 4 mg via INTRAVENOUS
  Filled 2018-04-28: qty 1

## 2018-04-28 MED ORDER — ONDANSETRON 4 MG PO TBDP: 4.0000 mg | ORAL_TABLET | Freq: Four times a day (QID) | ORAL | Status: DC | PRN

## 2018-04-28 MED ORDER — ACETAMINOPHEN 650 MG RE SUPP: 650.0000 mg | Freq: Four times a day (QID) | RECTAL | Status: DC | PRN

## 2018-04-28 MED ORDER — MORPHINE SULFATE (PF) 2 MG/ML IV SOLN
1.0000 mg | INTRAVENOUS | Status: DC | PRN
  Administered 2018-04-28 – 2018-04-29 (×4): 2 mg via INTRAVENOUS
  Filled 2018-04-28 (×4): qty 1

## 2018-04-28 MED ORDER — DIPHENHYDRAMINE HCL 50 MG/ML IJ SOLN: 25.0000 mg | Freq: Four times a day (QID) | INTRAMUSCULAR | Status: DC | PRN

## 2018-04-28 MED ORDER — ACETAMINOPHEN 325 MG PO TABS: 650.0000 mg | ORAL_TABLET | Freq: Four times a day (QID) | ORAL | Status: DC | PRN

## 2018-04-28 MED ORDER — ONDANSETRON HCL 4 MG/2ML IJ SOLN
4.0000 mg | Freq: Four times a day (QID) | INTRAMUSCULAR | Status: DC | PRN
  Administered 2018-04-28: 4 mg via INTRAVENOUS
  Filled 2018-04-28: qty 2

## 2018-04-28 MED ORDER — MORPHINE SULFATE (PF) 4 MG/ML IV SOLN: 4.0000 mg | INTRAVENOUS | Status: DC | PRN

## 2018-04-28 MED ORDER — ONDANSETRON HCL 4 MG/2ML IJ SOLN
4.0000 mg | Freq: Once | INTRAMUSCULAR | Status: AC
  Administered 2018-04-28: 4 mg via INTRAVENOUS
  Filled 2018-04-28: qty 2

## 2018-04-28 MED ORDER — PROMETHAZINE HCL 25 MG RE SUPP
25.0000 mg | Freq: Four times a day (QID) | RECTAL | Status: DC | PRN
  Administered 2018-04-28: 25 mg via RECTAL
  Filled 2018-04-28: qty 1

## 2018-04-28 NOTE — ED Triage Notes (Signed)
Pt c/o epigastric pain since Monday night; states worse after eating

## 2018-04-28 NOTE — ED Provider Notes (Signed)
[facility] EMERGENCY DEPARTMENT Provider Note   CSN: [id number] Arrival date & time: 04/28/18  4098     History   Chief Complaint Chief Complaint  Patient presents with  . Abdominal Pain    HPI Caroline Harrell is a 32 y.o. female with no known PMHx, who presents to the ED with complaints of upper abdominal pain that began 4 days ago after she ate 2 slices of pizza.  Patient describes the pain as 7/10 constant sharp upper abdominal pain which is nonradiating, worse with eating (particularly fatty meals), and unrelieved with mag citrate and castor oil.  She has had associated nausea and vomiting, had one episode of emesis on Monday and then 3-4 episodes today, all were nonbloody and nonbilious.  She drinks "a little bit" of wine last night but felt that it was making her symptoms worse so her friends mom made her a hot pocket, which then made her symptoms even worse and ultimately she vomited all of that up this morning.  She thought initially that she needed to "clean herself out", which is why she tried Mg Citrate and castor oil; she was not constipated at any point in time, she just figured that was going to clean her intestinal tract out.  She has not had an appetite since this started.  She hasn't eaten anything since the hot pocket last night.    She does not have a PCP at this time, and she denies having any known medical problems that she's aware of.  She has never had any abdominal surgeries.  She denies fevers, chills, CP, SOB, diarrhea/constipation, melena, hematochezia, hematemesis, hematuria, dysuria, vaginal bleeding/discharge, myalgias, arthralgias, numbness, tingling, focal weakness, or any other complaints at this time. Denies recent travel, sick contacts, suspicious food intake, or frequent NSAID use.   The history is provided by the patient and medical records. No language interpreter was used.  Abdominal Pain   Associated symptoms include nausea and vomiting.  Pertinent negatives include fever, diarrhea, constipation, dysuria, hematuria, arthralgias and myalgias.    History reviewed. No pertinent past medical history.  There are no active problems to display for this patient.   History reviewed. No pertinent surgical history.   OB History   None      Home Medications    Prior to Admission medications   Not on File    Family History No family history on file.  Social History Social History   Tobacco Use  . Smoking status: Never Smoker  . Smokeless tobacco: Never Used  Substance Use Topics  . Alcohol use: Yes    Alcohol/week: 0.0 oz    Comment: occasionally  . Drug use: No     Allergies   Patient has no known allergies.   Review of Systems Review of Systems  Constitutional: Positive for appetite change. Negative for chills and fever.  Respiratory: Negative for shortness of breath.   Cardiovascular: Negative for chest pain.  Gastrointestinal: Positive for abdominal pain, nausea and vomiting. Negative for blood in stool, constipation and diarrhea.  Genitourinary: Negative for dysuria, hematuria, vaginal bleeding and vaginal discharge.  Musculoskeletal: Negative for arthralgias and myalgias.  Skin: Negative for color change.  Allergic/Immunologic: Negative for immunocompromised state.  Neurological: Negative for weakness and numbness.  Psychiatric/Behavioral: Negative for confusion.   All other systems reviewed and are negative for acute change except as noted in the HPI.    Physical Exam Updated Vital Signs BP (!) 158/104 (BP Location: Right Arm)   Pulse  96   Temp 99.3 F (37.4 C) (Oral)   Ht  (1.702 m)   Wt 99.8 kg (220 lb)   LMP 03/24/2018   SpO2 100%   BMI 34.46 kg/m   Physical Exam  Constitutional: She is oriented to person, place, and time. Vital signs are normal. She appears well-developed and well-nourished.  Non-toxic appearance. No distress.  Afebrile, nontoxic, NAD although looks to feel  unwell  HENT:  Head: Normocephalic and atraumatic.  Mouth/Throat: Oropharynx is clear and moist and mucous membranes are normal.  Eyes: Conjunctivae and EOM are normal. Right eye exhibits no discharge. Left eye exhibits no discharge.  Neck: Normal range of motion. Neck supple.  Cardiovascular: Normal rate, regular rhythm, normal heart sounds and intact distal pulses. Exam reveals no gallop and no friction rub.  No murmur heard. Pulmonary/Chest: Effort normal and breath sounds normal. No respiratory distress. She has no decreased breath sounds. She has no wheezes. She has no rhonchi. She has no rales.  Abdominal: Soft. Normal appearance and bowel sounds are normal. She exhibits no distension. There is tenderness in the right upper quadrant and epigastric area. There is positive Murphy's sign. There is no rigidity, no rebound, no guarding, no CVA tenderness and no tenderness at McBurney's point.  Soft, nondistended, +BS throughout, with moderate RUQ and epigastric TTP, no r/g/r, +murphy's, neg mcburney's, no CVA TTP   Musculoskeletal: Normal range of motion.  Neurological: She is alert and oriented to person, place, and time. She has normal strength. No sensory deficit.  Skin: Skin is warm, dry and intact. No rash noted.  Psychiatric: She has a normal mood and affect.  Nursing note and vitals reviewed.    ED Treatments / Results  Labs (all labs ordered are listed, but only abnormal results are displayed) Labs Reviewed  COMPREHENSIVE METABOLIC PANEL - Abnormal; Notable for the following components:      Result Value   Glucose, Bld 107 (*)    All other components within normal limits  URINALYSIS, ROUTINE W REFLEX MICROSCOPIC - Abnormal; Notable for the following components:   Specific Gravity, Urine >1.030 (*)    Hgb urine dipstick SMALL (*)    Ketones, ur 40 (*)    Protein, ur 30 (*)    All other components within normal limits  URINALYSIS, MICROSCOPIC (REFLEX) - Abnormal; Notable for  the following components:   Bacteria, UA MANY (*)    All other components within normal limits  CBC WITH DIFFERENTIAL/PLATELET  LIPASE, BLOOD  PREGNANCY, URINE    EKG None  Radiology US Abdomen Complete  Result Date: 04/28/2018 CLINICAL DATA:  Epigastric pain with vomiting EXAM: ABDOMEN ULTRASOUND COMPLETE COMPARISON:  None. FINDINGS: Gallbladder: Gallbladder is distended. Within the gallbladder, there are multiple echogenic foci which move and shadow consistent with cholelithiasis. Largest gallstone measures 2.6 cm in length. There is a 1.6 cm calculus adherent in the neck of the gallbladder. There is gallbladder wall thickening with gallbladder wall edema and slight pericholecystic fluid. No sonographic Murphy sign noted by sonographer. Common bile duct: Diameter: 3 mm. No intrahepatic, common hepatic, or common bile duct dilatation. Liver: No focal lesion identified. Within normal limits in parenchymal echogenicity. Portal vein is patent on color Doppler imaging with normal direction of blood flow towards the liver. IVC: No abnormality visualized. Pancreas: No pancreatic mass or inflammatory focus. Spleen: Size and appearance within normal limits. Right Kidney: Length: 11.5 cm. Echogenicity within normal limits. No mass or hydronephrosis visualized. Left Kidney: Length: 10.6 cm.  Echogenicity within normal limits. No mass or hydronephrosis visualized. Abdominal aorta: No aneurysm visualized. Other findings: No demonstrable ascites. IMPRESSION: Distended gallbladder with cholelithiasis and gallstone adherent in the neck. There is gallbladder wall thickening with edematous change and slight pericholecystic fluid. These are findings felt to be evident of acute cholecystitis. Study otherwise unremarkable. Electronically Signed   By: Bretta Bang III M.D.   On: 04/28/2018 11:43    Procedures Procedures (including critical care time)  Medications Ordered in ED Medications  cefTRIAXone  (ROCEPHIN) 2 g in sodium chloride 0.9 % 100 mL IVPB (2 g Intravenous New Bag/Given 04/28/18 1214)  sodium chloride 0.9 % bolus 1,000 mL (0 mLs Intravenous Stopped 04/28/18 1205)  ondansetron (ZOFRAN) injection 4 mg (4 mg Intravenous Given 04/28/18 1056)  morphine 4 MG/ML injection 4 mg (4 mg Intravenous Given 04/28/18 1055)  famotidine (PEPCID) IVPB 20 mg premix (0 mg Intravenous Stopped 04/28/18 1205)     Initial Impression / Assessment and Plan / ED Course  I have reviewed the triage vital signs and the nursing notes.  Pertinent labs & imaging results that were available during my care of the patient were reviewed by me and considered in my medical decision making (see chart for details).     32 y.o. female here with upper abd pain x4 days after eating pizza. Has some n/v as well, mostly related to eating fatty meals. On exam, moderate epigastric and RUQ TTP, +murphys sign, otherwise nonperitoneal. DDx includes gastritis/GERD vs gallbladder pathology vs pancreatitis, etc. Will get labs and abd U/S, give fluids/pain and nausea meds, then reassess shortly. Will hold off on GI cocktail until after nausea is improving.   12:01 PM CBC w/diff WNL. CMP WNL. Lipase WNL. U/A and Upreg not yet done, nursing staff to get this ASAP now. Abd U/S showing distended gallbladder with gallstones adherent in the neck and gallbladder wall thickening and edematous changes and pericholecystic fluid, consistent with acute cholecystitis. Will start empiric abx, and consult surgery team.  Discussed case with my attending Dr. Juleen China who agrees with plan.   12:29 PM U/A with protein and ketones likely from mild dehydration, appears contaminated with 6-10 squamous, only 0-5 WBC and RBCs so doubt UTI; doubt UCx would be helpful in this contaminated urine sample. Upreg neg. Dr. Maisie Fus of [facility] returning page and will admit. I have placed temporary admit orders so that she can get a bed request for a med-surg bed, per Dr. Maisie Fus'  request. Remainder of holding/admit orders to be placed by admitting team. Please see their notes for further documentation of care. I appreciate their help with this pleasant pt's care. Pt stable at time of admission.    Final Clinical Impressions(s) / ED Diagnoses   Final diagnoses:  Acute cholecystitis  Upper abdominal pain  Nausea and vomiting in adult patient    ED Discharge Orders    [address], [place], [place] 04/28/18 1230    Raeford Razor, MD 04/28/18 [phone]

## 2018-04-28 NOTE — H&P (Signed)
[facility] Admission Note  Caroline Harrell [birth year]  [id number].    Requesting MD: Wilson Singer, MD Chief Complaint/Reason for Consult: acute cholecystitis   HPI:  Caroline Harrell is a 32 year old female with no known medical problems who presented to [facility] for evaluation of 4 days of abdominal pain.  Pain described as upper abdominal pain that is sharp, constant, and started after eating pizza.  Pain is nonradiating.  Pain is exacerbated by fatty foods.  Patient took laxatives without relief. Patient denies similar pain in the past.  Associated symptoms include nausea.  Work-up in the emergency department included a right upper quadrant ultrasound which showed a distended gallbladder with gallstones and a gallstone in the gallbladder neck.  There is gallbladder wall thickening and pericholecystic fluid consistent with acute cholecystitis.  General surgery was asked to admit the patient for surgical management.  ROS: Review of Systems  Constitutional: Negative for chills and fever.  HENT: Negative for hearing loss.   Eyes: Negative for blurred vision.  Cardiovascular: Negative for chest pain and palpitations.  Gastrointestinal: Positive for abdominal pain and nausea. Negative for heartburn and vomiting.  Genitourinary: Negative for dysuria, frequency and urgency.  Skin: Negative for itching and rash.  Neurological: Negative for dizziness and headaches.    No family history on file.  History reviewed. No pertinent past medical history.  History reviewed. No pertinent surgical history.  Social History:  reports that she has never smoked. She has never used smokeless tobacco. She reports that she drinks alcohol. She reports that she does not use drugs.  Allergies: No Known Allergies  Medications Prior to Admission  Medication Sig Dispense Refill  . acetaminophen (TYLENOL) 500 MG tablet Take 500 mg by mouth every 6 (six) hours as needed.    Marland Kitchen ibuprofen (ADVIL,MOTRIN)  200 MG tablet Take 200 mg by mouth every 6 (six) hours as needed.    . phentermine 37.5 MG capsule Take 37.5 mg by mouth every morning.      Blood pressure (!) 195/139, pulse 85, temperature 98.4 F (36.9 C), temperature source Oral, resp. rate 18, height 5' 7"  (1.702 m), weight 99.8 kg (220 lb), last menstrual period 03/24/2018, SpO2 100 %. Physical Exam: Physical Exam  Constitutional: She appears well-developed and well-nourished.  HENT:  Head: Normocephalic and atraumatic.  Eyes: Pupils are equal, round, and reactive to light. EOM are normal.  Cardiovascular: Normal rate and regular rhythm.  Pulmonary/Chest: Effort normal. No stridor.  Abdominal: Soft. Normal appearance. There is tenderness in the right upper quadrant.  Skin: Skin is warm and dry.    Results for orders placed or performed during the hospital encounter of 04/28/18 (from the past 48 hour(s))  CBC with Differential     Status: None   Collection Time: 04/28/18 11:01 AM  Result Value Ref Range   WBC 9.6 4.0 - 10.5 K/uL   RBC 4.69 3.87 - 5.11 MIL/uL   Hemoglobin 13.3 12.0 - 15.0 g/dL   HCT 38.1 36.0 - 46.0 %   MCV 81.2 78.0 - 100.0 fL   MCH 28.4 26.0 - 34.0 pg   MCHC 34.9 30.0 - 36.0 g/dL   RDW 14.4 11.5 - 15.5 %   Platelets 364 150 - 400 K/uL   Neutrophils Relative % 78 %   Neutro Abs 7.4 1.7 - 7.7 K/uL   Lymphocytes Relative 16 %   Lymphs Abs 1.6 0.7 - 4.0 K/uL   Monocytes Relative 5 %   Monocytes Absolute 0.5 0.1 -  1.0 K/uL   Eosinophils Relative 1 %   Eosinophils Absolute 0.1 0.0 - 0.7 K/uL   Basophils Relative 0 %   Basophils Absolute 0.0 0.0 - 0.1 K/uL    Comment: Performed at [facility], [address] [place], [place] [address]  Comprehensive metabolic panel     Status: Abnormal   Collection Time: 04/28/18 11:01 AM  Result Value Ref Range   Sodium 136 135 - 145 mmol/L   Potassium 3.6 3.5 - 5.1 mmol/L   Chloride 104 101 - 111 mmol/L   CO2 24 22 - 32 mmol/L   Glucose, Bld 107 (H) 65 -  99 mg/dL   BUN 6 6 - 20 mg/dL   Creatinine, Ser 0.74 0.44 - 1.00 mg/dL   Calcium 9.0 8.9 - 10.3 mg/dL   Total Protein 8.0 6.5 - 8.1 g/dL   Albumin 4.1 3.5 - 5.0 g/dL   AST 19 15 - 41 U/L   ALT 14 14 - 54 U/L   Alkaline Phosphatase 70 38 - 126 U/L   Total Bilirubin 0.3 0.3 - 1.2 mg/dL   GFR calc non Af Amer >60 >60 mL/min   GFR calc Af Amer >60 >60 mL/min    Comment: (NOTE) The eGFR has been calculated using the CKD EPI equation. This calculation has not been validated in all clinical situations. eGFR's persistently <60 mL/min signify possible Chronic Kidney Disease.    Anion gap 8 5 - 15    Comment: Performed at [facility], [address] [place], [place] [address]  Lipase, blood     Status: None   Collection Time: 04/28/18 11:01 AM  Result Value Ref Range   Lipase 33 11 - 51 U/L    Comment: Performed at [facility], [address] [place], [place] [address]  Urinalysis, Routine w reflex microscopic     Status: Abnormal   Collection Time: 04/28/18 12:06 PM  Result Value Ref Range   Color, Urine YELLOW YELLOW   APPearance CLEAR CLEAR   Specific Gravity, Urine >1.030 (H) 1.005 - 1.030   pH 5.5 5.0 - 8.0   Glucose, UA NEGATIVE NEGATIVE mg/dL   Hgb urine dipstick SMALL (A) NEGATIVE   Bilirubin Urine NEGATIVE NEGATIVE   Ketones, ur 40 (A) NEGATIVE mg/dL   Protein, ur 30 (A) NEGATIVE mg/dL   Nitrite NEGATIVE NEGATIVE   Leukocytes, UA NEGATIVE NEGATIVE    Comment: Performed at [facility], [address] [place], [place] [address]  Pregnancy, urine     Status: None   Collection Time: 04/28/18 12:06 PM  Result Value Ref Range   Preg Test, Ur NEGATIVE NEGATIVE    Comment:        THE SENSITIVITY OF THIS METHODOLOGY IS >20 mIU/mL. Performed at [facility], [address] [place], [place] [address]   Urinalysis, Microscopic (reflex)     Status: Abnormal   Collection Time: 04/28/18 12:06 PM  Result Value Ref Range   RBC / HPF  0-5 0 - 5 RBC/hpf   WBC, UA 0-5 0 - 5 WBC/hpf   Bacteria, UA MANY (A) NONE SEEN   Squamous Epithelial / LPF 6-10 0 - 5    Comment: Performed at [facility], [address], [place], [place] [address]   US Abdomen Complete  Result Date: 04/28/2018 CLINICAL DATA:  Epigastric pain with vomiting EXAM: ABDOMEN ULTRASOUND COMPLETE COMPARISON:  None. FINDINGS:  Gallbladder: Gallbladder is distended. Within the gallbladder, there are multiple echogenic foci which move and shadow consistent with cholelithiasis. Largest gallstone measures 2.6 cm in length. There is a 1.6 cm calculus adherent in the neck of the gallbladder. There is gallbladder wall thickening with gallbladder wall edema and slight pericholecystic fluid. No sonographic Murphy sign noted by sonographer. Common bile duct: Diameter: 3 mm. No intrahepatic, common hepatic, or common bile duct dilatation. Liver: No focal lesion identified. Within normal limits in parenchymal echogenicity. Portal vein is patent on color Doppler imaging with normal direction of blood flow towards the liver. IVC: No abnormality visualized. Pancreas: No pancreatic mass or inflammatory focus. Spleen: Size and appearance within normal limits. Right Kidney: Length: 11.5 cm. Echogenicity within normal limits. No mass or hydronephrosis visualized. Left Kidney: Length: 10.6 cm. Echogenicity within normal limits. No mass or hydronephrosis visualized. Abdominal aorta: No aneurysm visualized. Other findings: No demonstrable ascites. IMPRESSION: Distended gallbladder with cholelithiasis and gallstone adherent in the neck. There is gallbladder wall thickening with edematous change and slight pericholecystic fluid. These are findings felt to be evident of acute cholecystitis. Study otherwise unremarkable. Electronically Signed   By: Lowella Grip III M.D.   On: 04/28/2018 11:43   Assessment/Plan Acute calculus cholecystitis Afebrile, vital signs stable, CBC and CMET are  within normal limits.  Right upper quadrant ultrasound and physical exam, along with patient history, consistent with acute cholecystitis.  Recommend admission to the general surgery service and n.p.o. after midnight for laparoscopic cholecystectomy with possible intraoperative cholangiogram tomorrow.  IV antibiotics started.    Rosario Adie, MD  [facility]

## 2018-04-28 NOTE — Progress Notes (Signed)
Patient is transferred from  [facility] to [facility] 1530

## 2018-04-29 ENCOUNTER — Encounter (HOSPITAL_COMMUNITY): Payer: Self-pay | Admitting: Surgery

## 2018-04-29 ENCOUNTER — Observation Stay (HOSPITAL_COMMUNITY): Payer: Self-pay | Admitting: Certified Registered Nurse Anesthetist

## 2018-04-29 ENCOUNTER — Encounter (HOSPITAL_COMMUNITY): Admission: EM | Disposition: A | Discharge status: Home / Self Care | Payer: Self-pay | Source: Home / Self Care

## 2018-04-29 HISTORY — PX: CHOLECYSTECTOMY: SHX55

## 2018-04-29 LAB — CBC
HCT: 40.6 % (ref 36.0–46.0)
Hemoglobin: 13.6 g/dL (ref 12.0–15.0)
MCH: 27.4 pg (ref 26.0–34.0)
MCHC: 33.5 g/dL (ref 30.0–36.0)
MCV: 81.7 fL (ref 78.0–100.0)
PLATELETS: 349 10*3/uL (ref 150–400)
RBC: 4.97 MIL/uL (ref 3.87–5.11)
RDW: 14.4 % (ref 11.5–15.5)
WBC: 9.6 10*3/uL (ref 4.0–10.5)

## 2018-04-29 LAB — CREATININE, SERUM: CREATININE: 0.76 mg/dL (ref 0.44–1.00)

## 2018-04-29 LAB — SURGICAL PCR SCREEN
MRSA, PCR: NEGATIVE
STAPHYLOCOCCUS AUREUS: NEGATIVE

## 2018-04-29 LAB — HIV ANTIBODY (ROUTINE TESTING W REFLEX): HIV SCREEN 4TH GENERATION: NONREACTIVE

## 2018-04-29 SURGERY — LAPAROSCOPIC CHOLECYSTECTOMY WITH INTRAOPERATIVE CHOLANGIOGRAM
Anesthesia: General | Site: Abdomen

## 2018-04-29 MED ORDER — ONDANSETRON HCL 4 MG/2ML IJ SOLN
INTRAMUSCULAR | Status: AC
  Filled 2018-04-29: qty 2

## 2018-04-29 MED ORDER — PROMETHAZINE HCL 25 MG/ML IJ SOLN: 6.2500 mg | INTRAMUSCULAR | Status: DC | PRN

## 2018-04-29 MED ORDER — ROCURONIUM BROMIDE 10 MG/ML (PF) SYRINGE
PREFILLED_SYRINGE | INTRAVENOUS | Status: AC
  Filled 2018-04-29: qty 5

## 2018-04-29 MED ORDER — FENTANYL CITRATE (PF) 250 MCG/5ML IJ SOLN
INTRAMUSCULAR | Status: AC
  Filled 2018-04-29: qty 5

## 2018-04-29 MED ORDER — IBUPROFEN 200 MG PO TABS
600.0000 mg | ORAL_TABLET | Freq: Four times a day (QID) | ORAL | Status: DC
  Administered 2018-04-29 – 2018-04-30 (×3): 600 mg via ORAL
  Filled 2018-04-29 (×2): qty 3

## 2018-04-29 MED ORDER — ONDANSETRON HCL 4 MG/2ML IJ SOLN: 4.0000 mg | Freq: Four times a day (QID) | INTRAMUSCULAR | Status: DC | PRN

## 2018-04-29 MED ORDER — LABETALOL HCL 5 MG/ML IV SOLN
INTRAVENOUS | Status: DC | PRN
  Administered 2018-04-29: 2.5 mg via INTRAVENOUS
  Administered 2018-04-29: 5 mg via INTRAVENOUS
  Administered 2018-04-29: 2.5 mg via INTRAVENOUS

## 2018-04-29 MED ORDER — SCOPOLAMINE 1 MG/3DAYS TD PT72
MEDICATED_PATCH | TRANSDERMAL | Status: AC
  Filled 2018-04-29: qty 1

## 2018-04-29 MED ORDER — LACTATED RINGERS IR SOLN
Status: DC | PRN
  Administered 2018-04-29: 1000 mL

## 2018-04-29 MED ORDER — LACTATED RINGERS IV SOLN
INTRAVENOUS | Status: DC
  Administered 2018-04-29: 18:00:00 via INTRAVENOUS

## 2018-04-29 MED ORDER — DEXAMETHASONE SODIUM PHOSPHATE 10 MG/ML IJ SOLN
INTRAMUSCULAR | Status: AC
  Filled 2018-04-29: qty 2

## 2018-04-29 MED ORDER — 0.9 % SODIUM CHLORIDE (POUR BTL) OPTIME
TOPICAL | Status: DC | PRN
  Administered 2018-04-29: 1000 mL

## 2018-04-29 MED ORDER — BUPIVACAINE-EPINEPHRINE 0.25% -1:200000 IJ SOLN
INTRAMUSCULAR | Status: DC | PRN
  Administered 2018-04-29: 30 mL

## 2018-04-29 MED ORDER — HEPARIN SODIUM (PORCINE) 5000 UNIT/ML IJ SOLN
5000.0000 [IU] | Freq: Three times a day (TID) | INTRAMUSCULAR | Status: DC
  Administered 2018-04-29 – 2018-04-30 (×2): 5000 [IU] via SUBCUTANEOUS
  Filled 2018-04-29 (×2): qty 1

## 2018-04-29 MED ORDER — TRAMADOL HCL 50 MG PO TABS: 50.0000 mg | ORAL_TABLET | Freq: Four times a day (QID) | ORAL | Status: DC | PRN

## 2018-04-29 MED ORDER — BUPIVACAINE-EPINEPHRINE (PF) 0.25% -1:200000 IJ SOLN
INTRAMUSCULAR | Status: AC
  Filled 2018-04-29: qty 30

## 2018-04-29 MED ORDER — ONDANSETRON HCL 4 MG/2ML IJ SOLN
INTRAMUSCULAR | Status: DC | PRN
  Administered 2018-04-29: 4 mg via INTRAVENOUS

## 2018-04-29 MED ORDER — LIDOCAINE 2% (20 MG/ML) 5 ML SYRINGE
INTRAMUSCULAR | Status: DC | PRN
  Administered 2018-04-29: 80 mg via INTRAVENOUS

## 2018-04-29 MED ORDER — FENTANYL CITRATE (PF) 100 MCG/2ML IJ SOLN
INTRAMUSCULAR | Status: DC | PRN
  Administered 2018-04-29: 25 ug via INTRAVENOUS
  Administered 2018-04-29 (×2): 50 ug via INTRAVENOUS
  Administered 2018-04-29: 100 ug via INTRAVENOUS
  Administered 2018-04-29: 50 ug via INTRAVENOUS

## 2018-04-29 MED ORDER — SUCCINYLCHOLINE CHLORIDE 200 MG/10ML IV SOSY
PREFILLED_SYRINGE | INTRAVENOUS | Status: AC
  Filled 2018-04-29: qty 10

## 2018-04-29 MED ORDER — MIDAZOLAM HCL 2 MG/2ML IJ SOLN
INTRAMUSCULAR | Status: AC
  Filled 2018-04-29: qty 2

## 2018-04-29 MED ORDER — SUGAMMADEX SODIUM 200 MG/2ML IV SOLN
INTRAVENOUS | Status: AC
  Filled 2018-04-29: qty 2

## 2018-04-29 MED ORDER — DEXAMETHASONE SODIUM PHOSPHATE 10 MG/ML IJ SOLN
INTRAMUSCULAR | Status: DC | PRN
  Administered 2018-04-29: 10 mg via INTRAVENOUS

## 2018-04-29 MED ORDER — ROCURONIUM BROMIDE 50 MG/5ML IV SOSY
PREFILLED_SYRINGE | INTRAVENOUS | Status: DC | PRN
  Administered 2018-04-29: 50 mg via INTRAVENOUS
  Administered 2018-04-29: 10 mg via INTRAVENOUS

## 2018-04-29 MED ORDER — LACTATED RINGERS IV SOLN
INTRAVENOUS | Status: DC
  Administered 2018-04-29 (×2): via INTRAVENOUS

## 2018-04-29 MED ORDER — SUGAMMADEX SODIUM 200 MG/2ML IV SOLN
INTRAVENOUS | Status: DC | PRN
  Administered 2018-04-29: 200 mg via INTRAVENOUS

## 2018-04-29 MED ORDER — MEPERIDINE HCL 50 MG/ML IJ SOLN: 6.2500 mg | INTRAMUSCULAR | Status: DC | PRN

## 2018-04-29 MED ORDER — ALUM & MAG HYDROXIDE-SIMETH 200-200-20 MG/5ML PO SUSP: 30.0000 mL | Freq: Four times a day (QID) | ORAL | Status: DC | PRN

## 2018-04-29 MED ORDER — PHENYLEPHRINE 40 MCG/ML (10ML) SYRINGE FOR IV PUSH (FOR BLOOD PRESSURE SUPPORT)
PREFILLED_SYRINGE | INTRAVENOUS | Status: DC | PRN
  Administered 2018-04-29 (×5): 80 ug via INTRAVENOUS

## 2018-04-29 MED ORDER — MIDAZOLAM HCL 5 MG/5ML IJ SOLN
INTRAMUSCULAR | Status: DC | PRN
  Administered 2018-04-29: 1 mg via INTRAVENOUS

## 2018-04-29 MED ORDER — SUCCINYLCHOLINE CHLORIDE 200 MG/10ML IV SOSY
PREFILLED_SYRINGE | INTRAVENOUS | Status: DC | PRN
  Administered 2018-04-29: 100 mg via INTRAVENOUS

## 2018-04-29 MED ORDER — HYDROMORPHONE HCL 1 MG/ML IJ SOLN
0.5000 mg | INTRAMUSCULAR | Status: DC | PRN
  Administered 2018-04-29 (×2): 0.5 mg via INTRAVENOUS
  Filled 2018-04-29 (×2): qty 0.5

## 2018-04-29 MED ORDER — SCOPOLAMINE 1 MG/3DAYS TD PT72
MEDICATED_PATCH | TRANSDERMAL | Status: DC | PRN
  Administered 2018-04-29: 1 via TRANSDERMAL

## 2018-04-29 MED ORDER — LABETALOL HCL 5 MG/ML IV SOLN
INTRAVENOUS | Status: AC
  Filled 2018-04-29: qty 4

## 2018-04-29 MED ORDER — LIDOCAINE 2% (20 MG/ML) 5 ML SYRINGE
INTRAMUSCULAR | Status: AC
  Filled 2018-04-29: qty 5

## 2018-04-29 MED ORDER — ACETAMINOPHEN 500 MG PO TABS
1000.0000 mg | ORAL_TABLET | Freq: Four times a day (QID) | ORAL | Status: DC
  Administered 2018-04-29 – 2018-04-30 (×3): 1000 mg via ORAL
  Filled 2018-04-29 (×3): qty 2

## 2018-04-29 MED ORDER — HYDRALAZINE HCL 20 MG/ML IJ SOLN
5.0000 mg | INTRAMUSCULAR | Status: DC | PRN
  Administered 2018-04-29 (×2): 5 mg via INTRAVENOUS
  Filled 2018-04-29 (×2): qty 1

## 2018-04-29 MED ORDER — HYDROMORPHONE HCL 1 MG/ML IJ SOLN: 0.2500 mg | INTRAMUSCULAR | Status: DC | PRN

## 2018-04-29 MED ORDER — PROPOFOL 10 MG/ML IV BOLUS
INTRAVENOUS | Status: DC | PRN
  Administered 2018-04-29: 200 mg via INTRAVENOUS

## 2018-04-29 MED ORDER — PROPOFOL 10 MG/ML IV BOLUS
INTRAVENOUS | Status: AC
  Filled 2018-04-29: qty 20

## 2018-04-29 SURGICAL SUPPLY — 38 items
APPLICATOR ARISTA FLEXITIP XL (MISCELLANEOUS) ×3 IMPLANT
APPLIER CLIP 5 13 M/L LIGAMAX5 (MISCELLANEOUS) ×3
CABLE HIGH FREQUENCY MONO STRZ (ELECTRODE) ×3 IMPLANT
CHLORAPREP W/TINT 26ML (MISCELLANEOUS) ×3 IMPLANT
CLIP APPLIE 5 13 M/L LIGAMAX5 (MISCELLANEOUS) ×1 IMPLANT
COVER MAYO STAND STRL (DRAPES) IMPLANT
DERMABOND ADVANCED (GAUZE/BANDAGES/DRESSINGS) ×2
DERMABOND ADVANCED .7 DNX12 (GAUZE/BANDAGES/DRESSINGS) ×1 IMPLANT
DRAPE C-ARM 42X120 X-RAY (DRAPES) IMPLANT
DRAPE LAPAROSCOPIC ABDOMINAL (DRAPES) ×3 IMPLANT
ELECT PENCIL ROCKER SW 15FT (MISCELLANEOUS) ×3 IMPLANT
ELECT REM PT RETURN 15FT ADLT (MISCELLANEOUS) ×3 IMPLANT
GLOVE BIO SURGEON STRL SZ 6.5 (GLOVE) ×2 IMPLANT
GLOVE BIO SURGEONS STRL SZ 6.5 (GLOVE) ×1
GLOVE BIOGEL PI IND STRL 7.0 (GLOVE) ×1 IMPLANT
GLOVE BIOGEL PI INDICATOR 7.0 (GLOVE) ×2
GOWN STRL REUS W/TWL 2XL LVL3 (GOWN DISPOSABLE) ×3 IMPLANT
GOWN STRL REUS W/TWL XL LVL3 (GOWN DISPOSABLE) ×6 IMPLANT
HEMOSTAT ARISTA ABSORB 3G PWDR (MISCELLANEOUS) ×3 IMPLANT
IV CATH 14GX2 1/4 (CATHETERS) IMPLANT
KIT BASIN OR (CUSTOM PROCEDURE TRAY) ×3 IMPLANT
POUCH SPECIMEN RETRIEVAL 10MM (ENDOMECHANICALS) ×3 IMPLANT
SCISSORS LAP 5X35 DISP (ENDOMECHANICALS) ×3 IMPLANT
SET CHOLANGIOGRAPH MIX (MISCELLANEOUS) IMPLANT
SET IRRIG TUBING LAPAROSCOPIC (IRRIGATION / IRRIGATOR) ×3 IMPLANT
SLEEVE XCEL OPT CAN 5 100 (ENDOMECHANICALS) ×6 IMPLANT
SUT VIC AB 2-0 SH 27 (SUTURE) ×2
SUT VIC AB 2-0 SH 27X BRD (SUTURE) ×1 IMPLANT
SUT VIC AB 4-0 PS2 18 (SUTURE) ×3 IMPLANT
SUT VICRYL 0 UR6 27IN ABS (SUTURE) ×3 IMPLANT
TOWEL OR 17X26 10 PK STRL BLUE (TOWEL DISPOSABLE) ×3 IMPLANT
TOWEL OR NON WOVEN STRL DISP B (DISPOSABLE) ×3 IMPLANT
TRAY LAPAROSCOPIC (CUSTOM PROCEDURE TRAY) ×3 IMPLANT
TROCAR ADV FIXATION 12X100MM (TROCAR) ×3 IMPLANT
TROCAR BLADELESS OPT 5 100 (ENDOMECHANICALS) ×3 IMPLANT
TROCAR HASSON 5MM (TROCAR) ×3 IMPLANT
TROCAR XCEL BLUNT TIP 100MML (ENDOMECHANICALS) ×3 IMPLANT
TUBING INSUF HEATED (TUBING) ×3 IMPLANT

## 2018-04-29 NOTE — Progress Notes (Signed)
Acute cholecystitis  Subjective: Pain and bloating about the same  Objective: Vital signs in last 24 hours: Temp:  [98.4 F (36.9 C)-99.3 F (37.4 C)] 98.4 F (36.9 C) (05/23 0452) Pulse Rate:  [79-102] 102 (05/23 0452) Resp:  [14-20] 14 (05/22 2150) BP: (149-213)/(97-141) 175/105 (05/23 0452) SpO2:  [100 %] 100 % (05/23 0452) Weight:  [99.8 kg (220 lb)] 99.8 kg (220 lb) (05/22 1014) Last BM Date: 04/27/18  Intake/Output from previous day: 05/22 0701 - 05/23 0700 In: 694.2 [P.O.:60; I.V.:634.2] Out: -  Intake/Output this shift: No intake/output data recorded.  General appearance: alert and cooperative GI: TTP RUQ  Lab Results:  Results for orders placed or performed during the hospital encounter of 04/28/18 (from the past 24 hour(s))  CBC with Differential     Status: None   Collection Time: 04/28/18 11:01 AM  Result Value Ref Range   WBC 9.6 4.0 - 10.5 K/uL   RBC 4.69 3.87 - 5.11 MIL/uL   Hemoglobin 13.3 12.0 - 15.0 g/dL   HCT 95.6 21.3 - 08.6 %   MCV 81.2 78.0 - 100.0 fL   MCH 28.4 26.0 - 34.0 pg   MCHC 34.9 30.0 - 36.0 g/dL   RDW 57.8 46.9 - 62.9 %   Platelets 364 150 - 400 K/uL   Neutrophils Relative % 78 %   Neutro Abs 7.4 1.7 - 7.7 K/uL   Lymphocytes Relative 16 %   Lymphs Abs 1.6 0.7 - 4.0 K/uL   Monocytes Relative 5 %   Monocytes Absolute 0.5 0.1 - 1.0 K/uL   Eosinophils Relative 1 %   Eosinophils Absolute 0.1 0.0 - 0.7 K/uL   Basophils Relative 0 %   Basophils Absolute 0.0 0.0 - 0.1 K/uL  Comprehensive metabolic panel     Status: Abnormal   Collection Time: 04/28/18 11:01 AM  Result Value Ref Range   Sodium 136 135 - 145 mmol/L   Potassium 3.6 3.5 - 5.1 mmol/L   Chloride 104 101 - 111 mmol/L   CO2 24 22 - 32 mmol/L   Glucose, Bld 107 (H) 65 - 99 mg/dL   BUN 6 6 - 20 mg/dL   Creatinine, Ser 5.28 0.44 - 1.00 mg/dL   Calcium 9.0 8.9 - 41.3 mg/dL   Total Protein 8.0 6.5 - 8.1 g/dL   Albumin 4.1 3.5 - 5.0 g/dL   AST 19 15 - 41 U/L   ALT 14 14 - 54  U/L   Alkaline Phosphatase 70 38 - 126 U/L   Total Bilirubin 0.3 0.3 - 1.2 mg/dL   GFR calc non Af Amer >60 >60 mL/min   GFR calc Af Amer >60 >60 mL/min   Anion gap 8 5 - 15  Lipase, blood     Status: None   Collection Time: 04/28/18 11:01 AM  Result Value Ref Range   Lipase 33 11 - 51 U/L  Urinalysis, Routine w reflex microscopic     Status: Abnormal   Collection Time: 04/28/18 12:06 PM  Result Value Ref Range   Color, Urine YELLOW YELLOW   APPearance CLEAR CLEAR   Specific Gravity, Urine >1.030 (H) 1.005 - 1.030   pH 5.5 5.0 - 8.0   Glucose, UA NEGATIVE NEGATIVE mg/dL   Hgb urine dipstick SMALL (A) NEGATIVE   Bilirubin Urine NEGATIVE NEGATIVE   Ketones, ur 40 (A) NEGATIVE mg/dL   Protein, ur 30 (A) NEGATIVE mg/dL   Nitrite NEGATIVE NEGATIVE   Leukocytes, UA NEGATIVE NEGATIVE  Pregnancy, urine  Status: None   Collection Time: 04/28/18 12:06 PM  Result Value Ref Range   Preg Test, Ur NEGATIVE NEGATIVE  Urinalysis, Microscopic (reflex)     Status: Abnormal   Collection Time: 04/28/18 12:06 PM  Result Value Ref Range   RBC / HPF 0-5 0 - 5 RBC/hpf   WBC, UA 0-5 0 - 5 WBC/hpf   Bacteria, UA MANY (A) NONE SEEN   Squamous Epithelial / LPF 6-10 0 - 5     Studies/Results Radiology     MEDS, Scheduled . docusate sodium  100 mg Oral BID  . enoxaparin (LOVENOX) injection  40 mg Subcutaneous Q24H     Assessment: Acute cholecystitis  Plan: OR today for lap chole.  The anatomy & physiology of hepatobiliary & pancreatic function was discussed.  The pathophysiology of gallbladder dysfunction was discussed.  Natural history risks without surgery was discussed.   I feel the risks of no intervention will lead to serious problems that outweigh the operative risks; therefore, I recommended cholecystectomy to remove the pathology.  I explained laparoscopic techniques with possible need for an open approach.  Probable cholangiogram to evaluate the bilary tract was explained as well.     Risks such as bleeding, infection, abscess, leak, injury to other organs, need for further treatment, heart attack, death, and other risks were discussed.  I noted a good likelihood this will help address the problem.  Possibility that this will not correct all abdominal symptoms was explained.  Goals of post-operative recovery were discussed as well.  We will work to minimize complications.  An educational handout further explaining the pathology and treatment options was given as well.  Questions were answered.  The patient expresses understanding & wishes to proceed with surgery.  LOS: 1 day    Vanita Panda, MD [facility], [place] [phone]   04/29/2018 8:01 AM

## 2018-04-29 NOTE — Anesthesia Procedure Notes (Signed)
Date/Time: 04/29/2018 3:11 PM Performed by: Minerva Ends, CRNA Oxygen Delivery Method: Simple face mask Placement Confirmation: positive ETCO2 and breath sounds checked- equal and bilateral Dental Injury: Teeth and Oropharynx as per pre-operative assessment  Comments: Piercing intact to left upper lip at extubation and on arrival to PACU

## 2018-04-29 NOTE — Transfer of Care (Signed)
Immediate Anesthesia Transfer of Care Note  Patient: Caroline Harrell  Procedure(s) Performed: LAPAROSCOPIC CHOLECYSTECTOMY (N/A Abdomen)  Patient Location: PACU  Anesthesia Type:General  Level of Consciousness: sedated  Airway & Oxygen Therapy: Patient Spontanous Breathing and Patient connected to face mask oxygen  Post-op Assessment: Report given to RN and Post -op Vital signs reviewed and stable  Post vital signs: Reviewed and stable  Last Vitals:  Vitals Value Taken Time  BP 150/105 04/29/2018  3:17 PM  Temp    Pulse 103 04/29/2018  3:21 PM  Resp 21 04/29/2018  3:21 PM  SpO2 100 % 04/29/2018  3:21 PM  Vitals shown include unvalidated device data.  Last Pain:  Vitals:   04/29/18 1517  TempSrc:   PainSc: Asleep      Patients Stated Pain Goal: 3 (04/29/18 0200)  Complications: No apparent anesthesia complications

## 2018-04-29 NOTE — Op Note (Signed)
04/28/2018 - 04/29/2018 2:54 PM  PATIENT: Caroline Harrell  32 y.o. female  Patient Care Team: Patient, No Pcp Per as PCP - General (General Practice)  PRE-OPERATIVE DIAGNOSIS: cholecystitis  POST-OPERATIVE DIAGNOSIS: cholecystitis  PROCEDURE: Laparoscopic cholecystectomy  SURGEON: Stephanie Coup. Jamiesha Victoria, MD  ASSISTANT: Barnetta Chapel, PA-C  ANESTHESIA: General endotracheal  EBL: Total I/O In: 1000 [I.V.:1000] Out: 20 [Blood:20]  DRAINS: None  SPECIMEN: Gallbladder  COUNTS: Sponge, needle and instrument counts were reported correct x2 at the conclusion of the operation  DISPOSITION: PACU in satisfactory condition  COMPLICATIONS: None  FINDINGS: Critical view of safety obtained prior to clipping or dividing any structures. Significantly inflamed gallbladder with large gallstones. Intrahepatic gallbladder fossa.  Arista was placed in the gallbladder fossa at conclusion.  INDICATION: Ms. Caroline Harrell is a very pleasant [name] with 3-4d hx of RUQ and MEG abdominal pain.  She presented to med [facility] yesterday with abdominal pain.  Work-up was significant for normal LFTs and a Deshanae Lindo blood cell count.  Her lipase was normal. she underwent a right upper quadrant ultrasound which demonstrated gallstones and a thickened gallbladder wall and some pericholecystic fluid.  Common bile duct is normal in diameter.  She denied having had any prior operations.  She was diagnosed with acute cholecystitis.  She was recommended to undergo lap scopic versus open cholecystectomy.  Please refer to H&P for details regarding this discussion  DESCRIPTION:  The patient was identified & brought into the operating room. The patient was positioned supine on the OR table. SCDs were in place and active during the entire case. The patient underwent general endotracheal anesthesia. Pressure points were padded. The abdomen was prepped and draped in the standard sterile fashion and antibiotics were administered. A  surgical timeout was performed and confirmed our plan.   A periumbilical incision was made. The umbilical stalk was grasped and retracted outwardly. The infraumbilical fascia was identified and incised. The peritoneal cavity was gently entered bluntly. A purse-string 0 Vicryl suture was placed. The Hasson cannula was inserted into the peritoneal cavity and insufflation with CO2 commenced to . A laparoscope was inserted into the peritoneal cavity and inspection confirmed no evidence of trocar site complications. The patient was then positioned in reverse Trendelenburg with the left side down. 3 additional rocars were placed along the right subcostal line - one 5mm port in mid subcostal region, another 5mm port in the right flank near the anterior axillary line, and a third 10mm port in the left subxiphoid region obliquely near the falciform ligament.  The liver and gallbladder were inspected. The gallbladder fundus was grasped and elevated cephalad. An additional grasper was then placed on the infundibulum of the gallbladder and the infundibulum was retracted laterally. Gentle blunt dissection was then employed with a Art gallery manager working down into [employer]. The peritoneum on both sides of the gallbladder was opened with hook cautery. The cystic duct was identified, carefully circumferentially dissected. The cystic artery was then carefully circumferentially dissected. The space between the cystic artery and hepatocystic plate was developed such that the liver could be seen through a window medial to the cystic artery. The triangle of Calot was cleared of all fibrofatty tissue. At this point, a critical view of safety was achieved and the only structures visualized was the skeletonized cystic duct laterally, the skeletonized cystic artery and the liver through the window medial to the artery.   The cystic duct and artery were clipped with 2 clips on the "stay" side and 1  clip on the specimen  side. The cystic duct and artery were then divided. The gallbladder was then freed from its remaining attachments to the liver using electrocautery and placed into an endocatch bag.  Of note, the gallbladder was intrahepatic and stuck to the gallbladder fossa.  The RUQ was irrigated with normal saline. Hemostasis was achieved and then re-verified.  The gallbladder fossa was then coated with Arista. The rest of the abdomen was inspected no injury nor bleeding elsewhere was identified. The clips and gallbladder fossa were reinspected and noted to be in place and hemostatic.  The gallbladder and endocatch bag was then removed from the umbilical port site and passed off as specimen. The umbilical fascia was then closed using figure of eight 0 Vicryl sutures x2.  The abdomen was then reinsufflated and inspection revealed the repair of the umbilical fascia to be intact, complete, and nothing included within it. The RUQ ports were removed under direct visualization and noted to be hemostatic.  The skin of all incision sites was approximated with 4-0 monocryl subcuticular suture and dermabond applied. The patient was then extubated and transferred to a stretcher for transport to PACU in satisfactory condition.  Note: This dictation was prepared with Dragon/digital dictation along with [employer]. Any transcriptional errors that result from this process are unintentional.

## 2018-04-29 NOTE — Progress Notes (Signed)
Subjective Met with patient today due to issues with available surgeon coverage today.  [name] with no known past medical hx presented to [facility] with 3-4d of MEG + RUQ abdominal pain. Never had this before. Pain is sharp. Associated with nausea/vomiting.  She denies any prior operations or surgeries  Objective: Vital signs in last 24 hours: Temp:  [98.4 F (36.9 C)-98.6 F (37 C)] 98.4 F (36.9 C) (05/23 0452) Pulse Rate:  [83-102] 102 (05/23 0452) Resp:  [14-18] 14 (05/22 2150) BP: (149-213)/(105-141) 175/105 (05/23 0452) SpO2:  [100 %] 100 % (05/23 0452) Last BM Date: 04/27/18  Intake/Output from previous day: 05/22 0701 - 05/23 0700 In: 694.2 [P.O.:60; I.V.:634.2] Out: -  Intake/Output this shift: No intake/output data recorded.  Gen: NAD, comfortable CV: RRR Pulm: Normal work of breathing Abd: Soft, mildly ttp in RUQ; no rebound/guarding. Nondistended. Ext: SCDs in place  Lab Results: CBC  Recent Labs    04/28/18 1101  WBC 9.6  HGB 13.3  HCT 38.1  PLT 364   BMET Recent Labs    04/28/18 1101  NA 136  K 3.6  CL 104  CO2 24  GLUCOSE 107*  BUN 6  CREATININE 0.74  CALCIUM 9.0    Anti-infectives: Anti-infectives (From admission, onward)   Start     Dose/Rate Route Frequency Ordered Stop   04/29/18 1200  cefTRIAXone (ROCEPHIN) 2 g in sodium chloride 0.9 % 100 mL IVPB     2 g 200 mL/hr over 30 Minutes Intravenous Every 24 hours 04/28/18 1644     04/28/18 1200  cefTRIAXone (ROCEPHIN) 2 g in sodium chloride 0.9 % 100 mL IVPB     2 g 200 mL/hr over 30 Minutes Intravenous  Once 04/28/18 1153 04/28/18 1501     RUQ [place] 04/28/18 showed distended gallbladder with gallstones and gallstone adherent in neck. Gallbladder wall thickened mildly; largest gallstone is 2.6cm. GB wall edema with slight pericholecystic fluid. Negative Murphy's sign. CBD 61m.  LFTs normal Lipase normal WBC 9.6  Assessment/Plan: Patient Active Problem List   Diagnosis Date Noted  . Acute  cholecystitis 04/28/2018  . Cholecystitis 04/28/2018   Ms. GLetendreis a 89yoFwith acute cholecystitis  -Will plan laparoscopic vs open cholecystectomy -The anatomy and physiology of the hepatobiliary system was discussed at length with the patient with associated pictures. The pathophysiology of gallbladder disease was discussed at length with associated pictures. -The options for treatment were discussed including ongoing observation which may result in subsequent gallbladder complications (infection, pancreatitis, choledocholithiasis, etc). -The planned procedure, material risks (including, but not limited to, pain, bleeding, infection, scarring, need for blood transfusion, damage to surrounding structures- blood vessels/nerves/viscus/organs, damage to bile duct, bile leak, need for additional procedures, DVT/pulmonary embolism, hernia, pancreatitis, pneumonia, heart attack, stroke, death) benefits and alternatives to surgery were discussed at length. I noted a good probability that the procedure would help improve her symptoms. The patient's questions were answered to her satisfaction, she voiced understanding and she elected to proceed with surgery. Additionally, we discussed typical postoperative expectations and the recovery process.   LOS: 1 day   CSharon Mt WDema Severin M.D. [facility]

## 2018-04-29 NOTE — Anesthesia Postprocedure Evaluation (Signed)
Anesthesia Post Note  Patient: Caroline Harrell  Procedure(s) Performed: LAPAROSCOPIC CHOLECYSTECTOMY (N/A Abdomen)     Patient location during evaluation: PACU Anesthesia Type: General Level of consciousness: sedated and patient cooperative Pain management: pain level controlled Vital Signs Assessment: post-procedure vital signs reviewed and stable Respiratory status: spontaneous breathing Cardiovascular status: stable Anesthetic complications: no    Last Vitals:  Vitals:   04/29/18 1706 04/29/18 1809  BP: (!) 145/102   Pulse: (!) 109 90  Resp: 20 18  Temp: 36.7 C 37.2 C  SpO2: 99% 97%    Last Pain:  Vitals:   04/29/18 1839  TempSrc:   PainSc: Asleep                 Lewie Loron

## 2018-04-29 NOTE — Anesthesia Procedure Notes (Signed)
Procedure Name: Intubation Date/Time: 04/29/2018 1:15 PM Performed by: Montel Clock, CRNA Pre-anesthesia Checklist: Patient identified, Emergency Drugs available, Suction available, Patient being monitored and Timeout performed Patient Re-evaluated:Patient Re-evaluated prior to induction Oxygen Delivery Method: Circle system utilized Preoxygenation: Pre-oxygenation with 100% oxygen Induction Type: IV induction, Rapid sequence and Cricoid Pressure applied Laryngoscope Size: Mac and 3 Grade View: Grade II Tube type: Oral Tube size: 7.0 mm Number of attempts: 1 Airway Equipment and Method: Stylet Placement Confirmation: ETT inserted through vocal cords under direct vision,  positive ETCO2 and breath sounds checked- equal and bilateral Secured at: 22 cm Tube secured with: Tape Dental Injury: Teeth and Oropharynx as per pre-operative assessment

## 2018-04-29 NOTE — Anesthesia Preprocedure Evaluation (Signed)
Anesthesia Evaluation  Patient identified by MRN, date of birth, ID band Patient awake    Reviewed: Allergy & Precautions, NPO status , Patient's Chart, lab work & pertinent test results  Airway Mallampati: II  TM Distance: >3 FB Neck ROM: Full    Dental no notable dental hx.    Pulmonary neg pulmonary ROS,    Pulmonary exam normal breath sounds clear to auscultation       Cardiovascular negative cardio ROS Normal cardiovascular exam Rhythm:Regular Rate:Normal     Neuro/Psych negative neurological ROS  negative psych ROS   GI/Hepatic negative GI ROS, Neg liver ROS,   Endo/Other  negative endocrine ROS  Renal/GU negative Renal ROS     Musculoskeletal negative musculoskeletal ROS (+)   Abdominal   Peds  Hematology negative hematology ROS (+)   Anesthesia Other Findings   Reproductive/Obstetrics negative OB ROS                             Anesthesia Physical Anesthesia Plan  ASA: II  Anesthesia Plan: General   Post-op Pain Management:    Induction: Intravenous  PONV Risk Score and Plan: 4 or greater and Ondansetron, Dexamethasone, Scopolamine patch - Pre-op and Midazolam  Airway Management Planned: Oral ETT  Additional Equipment:   Intra-op Plan:   Post-operative Plan: Extubation in OR  Informed Consent: I have reviewed the patients History and Physical, chart, labs and discussed the procedure including the risks, benefits and alternatives for the proposed anesthesia with the patient or authorized representative who has indicated his/her understanding and acceptance.   Dental advisory given  Plan Discussed with: CRNA  Anesthesia Plan Comments:         Anesthesia Quick Evaluation

## 2018-04-30 LAB — BASIC METABOLIC PANEL
Anion gap: 9 (ref 5–15)
BUN: 7 mg/dL (ref 6–20)
CALCIUM: 8.7 mg/dL — AB (ref 8.9–10.3)
CO2: 25 mmol/L (ref 22–32)
CREATININE: 0.68 mg/dL (ref 0.44–1.00)
Chloride: 103 mmol/L (ref 101–111)
GFR calc non Af Amer: >60 mL/min (ref >60)
Glucose, Bld: 108 mg/dL — ABNORMAL HIGH (ref 65–99)
Potassium: 3.7 mmol/L (ref 3.5–5.1)
SODIUM: 137 mmol/L (ref 135–145)

## 2018-04-30 LAB — MAGNESIUM: Magnesium: 2 mg/dL (ref 1.7–2.4)

## 2018-04-30 LAB — CBC
HCT: 35.7 % — ABNORMAL LOW (ref 36.0–46.0)
Hemoglobin: 11.7 g/dL — ABNORMAL LOW (ref 12.0–15.0)
MCH: 27 pg (ref 26.0–34.0)
MCHC: 32.8 g/dL (ref 30.0–36.0)
MCV: 82.3 fL (ref 78.0–100.0)
PLATELETS: 361 10*3/uL (ref 150–400)
RBC: 4.34 MIL/uL (ref 3.87–5.11)
RDW: 14.7 % (ref 11.5–15.5)
WBC: 14.3 10*3/uL — AB (ref 4.0–10.5)

## 2018-04-30 LAB — PHOSPHORUS: PHOSPHORUS: 2.7 mg/dL (ref 2.5–4.6)

## 2018-04-30 MED ORDER — ACETAMINOPHEN 500 MG PO TABS: 1000.0000 mg | ORAL_TABLET | Freq: Four times a day (QID) | ORAL | 0 refills | Status: AC | PRN

## 2018-04-30 MED ORDER — IBUPROFEN 600 MG PO TABS: 600.0000 mg | ORAL_TABLET | Freq: Three times a day (TID) | ORAL | 0 refills | Status: AC | PRN

## 2018-04-30 MED FILL — Ondansetron HCl Inj 4 MG/2ML (2 MG/ML): INTRAMUSCULAR | Qty: 2 | Status: AC

## 2018-04-30 NOTE — Discharge Summary (Signed)
[facility] Discharge Summary   Patient ID: Caroline Harrell MRN: [id number] DOB/AGE: 04-20-1986 32 y.o.  Admit date: 04/28/2018 Discharge date: 04/30/2018  Discharge Diagnosis Patient Active Problem List   Diagnosis Date Noted  . Acute cholecystitis 04/28/2018  . Cholecystitis 04/28/2018   Imaging: US Abdomen Complete  Result Date: 04/28/2018 CLINICAL DATA:  Epigastric pain with vomiting EXAM: ABDOMEN ULTRASOUND COMPLETE COMPARISON:  None. FINDINGS: Gallbladder: Gallbladder is distended. Within the gallbladder, there are multiple echogenic foci which move and shadow consistent with cholelithiasis. Largest gallstone measures 2.6 cm in length. There is a 1.6 cm calculus adherent in the neck of the gallbladder. There is gallbladder wall thickening with gallbladder wall edema and slight pericholecystic fluid. No sonographic Murphy sign noted by sonographer. Common bile duct: Diameter: 3 mm. No intrahepatic, common hepatic, or common bile duct dilatation. Liver: No focal lesion identified. Within normal limits in parenchymal echogenicity. Portal vein is patent on color Doppler imaging with normal direction of blood flow towards the liver. IVC: No abnormality visualized. Pancreas: No pancreatic mass or inflammatory focus. Spleen: Size and appearance within normal limits. Right Kidney: Length: 11.5 cm. Echogenicity within normal limits. No mass or hydronephrosis visualized. Left Kidney: Length: 10.6 cm. Echogenicity within normal limits. No mass or hydronephrosis visualized. Abdominal aorta: No aneurysm visualized. Other findings: No demonstrable ascites. IMPRESSION: Distended gallbladder with cholelithiasis and gallstone adherent in the neck. There is gallbladder wall thickening with edematous change and slight pericholecystic fluid. These are findings felt to be evident of acute cholecystitis. Study otherwise unremarkable. Electronically Signed   By: Bretta Bang III M.D.   On: 04/28/2018  11:43    Procedures Dr. Marin Olp (04/29/18) - laparoscopic cholecystectomy   Hospital Course:  32 year old African-American female who presented to [facility] long hospital from med [facility] with epigastric abdominal pain associated with nausea and vomiting.  Work-up in the emergency department was significant for right upper quadrant ultrasound revealing a gallstone in the gallbladder neck with surrounding pericholecystic fluid consistent with acute cholecystitis.  The patient was admitted to the hospital and underwent the procedure above.  She tolerated the procedure well and was transferred to the floor.  On postop day #1 the patient's vitals were stable, pain controlled, mobilizing, tolerating oral intake, and medically stable for discharge home.  She will require follow-up in our office as below and knows to call with questions or concerns.  A work note was provided for the patient prior to discharge.  Physical Exam: General:  Alert, NAD, pleasant, comfortable Abd:  Soft, ND, appropriately tender, incisions C/D/I    Allergies as of 04/30/2018   No Known Allergies     Medication List    TAKE these medications   acetaminophen 500 MG tablet Commonly known as:  TYLENOL Take 2 tablets (1,000 mg total) by mouth every 6 (six) hours as needed. What changed:  how much to take   ibuprofen 600 MG tablet Commonly known as:  ADVIL,MOTRIN Take 1 tablet (600 mg total) by mouth every 8 (eight) hours as needed. What changed:    medication strength  how much to take  when to take this   phentermine 37.5 MG capsule Take 37.5 mg by mouth every morning.      Follow-up Information    [facility] Follow up.   Specialty:  General Surgery Why:  call and confirm post-operative appointment date/time. Contact information: [address] [place] [address] [phone]  Signed: Hosie Spangle, [facility] 04/30/2018, 9:10 AM Pager: [phone] Consults: [phone] Mon-Fri 7:00 am-4:30 pm Sat-Sun 7:00 am-11:30 am

## 2018-04-30 NOTE — Progress Notes (Signed)
Discharge instructions reviewed with patient. All questions answered. Patient ambulated to vehicle with belongings by nurse tech 

## 2018-04-30 NOTE — Discharge Instructions (Signed)

## 2019-08-11 ENCOUNTER — Other Ambulatory Visit: Payer: Self-pay | Admitting: Anesthesiology

## 2019-08-11 ENCOUNTER — Other Ambulatory Visit: Payer: Self-pay

## 2019-08-11 ENCOUNTER — Other Ambulatory Visit: Payer: Self-pay | Admitting: Plastic Surgery

## 2019-08-11 ENCOUNTER — Ambulatory Visit
Admission: RE | Admit: 2019-08-11 | Discharge: 2019-08-11 | Disposition: A | Discharge status: Home / Self Care | Payer: 59 | Source: Ambulatory Visit | Attending: Plastic Surgery | Admitting: Plastic Surgery

## 2019-08-11 DIAGNOSIS — Z01818 Encounter for other preprocedural examination: Secondary | ICD-10-CM

## 2019-12-31 IMAGING — US US ABDOMEN COMPLETE
1 series · 13 of 25 positions shown · non-contrast
Comparison: None.

CLINICAL DATA: Epigastric pain with vomiting

EXAM:
ABDOMEN ULTRASOUND COMPLETE

[Series 1: us abdomen complete · 0.25mm/px · 13 of 125 slices shown]
[im 1/125]
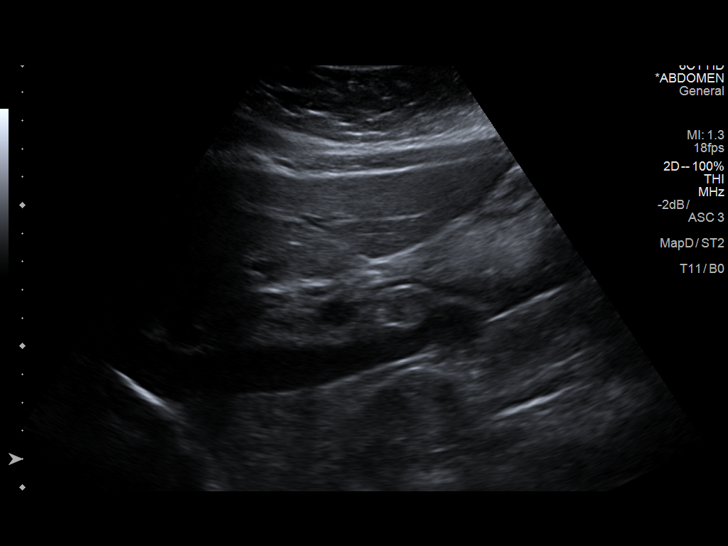
[im 11/125]
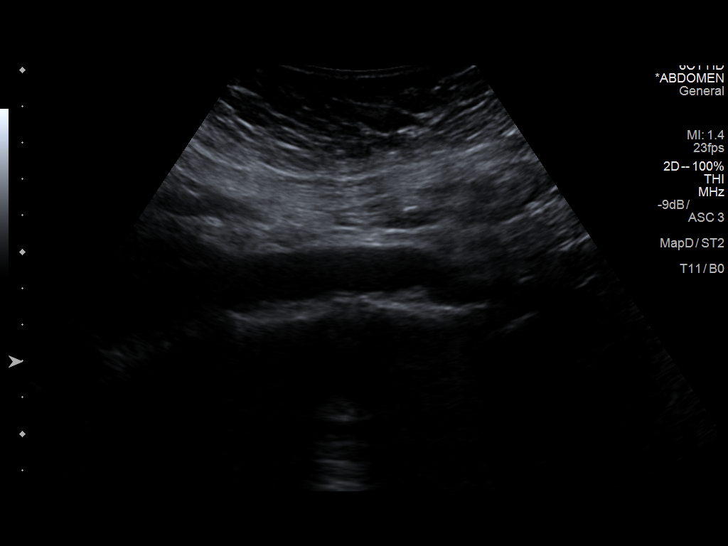
[im 21/125]
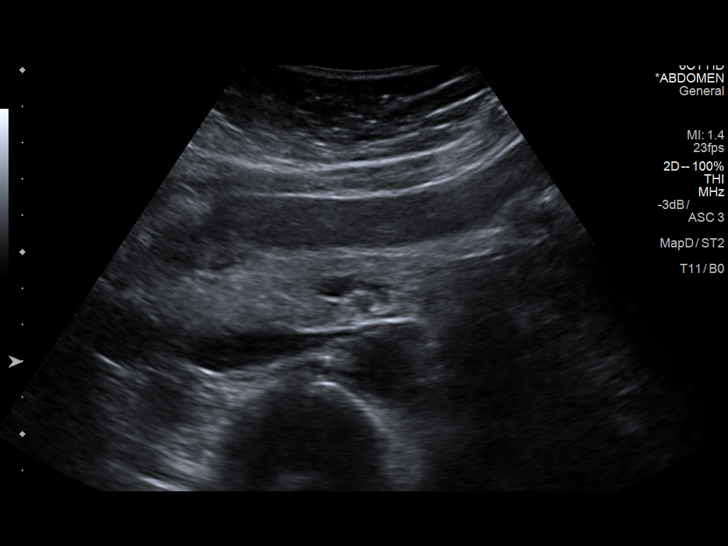
[im 32/125]
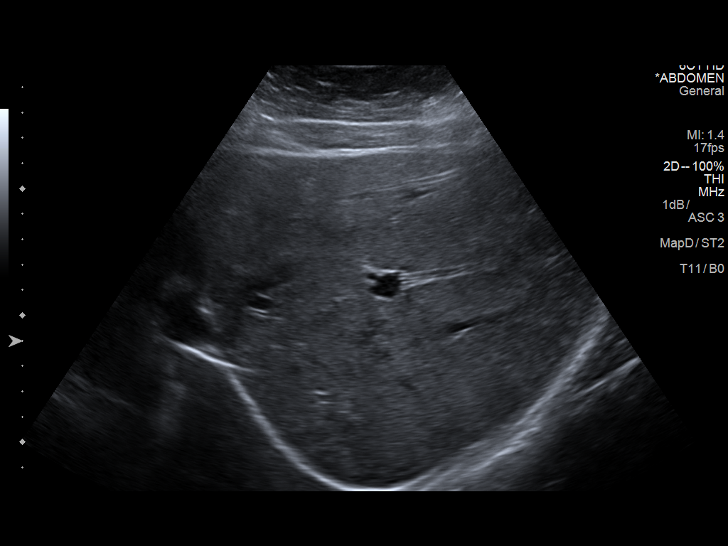
[im 42/125]
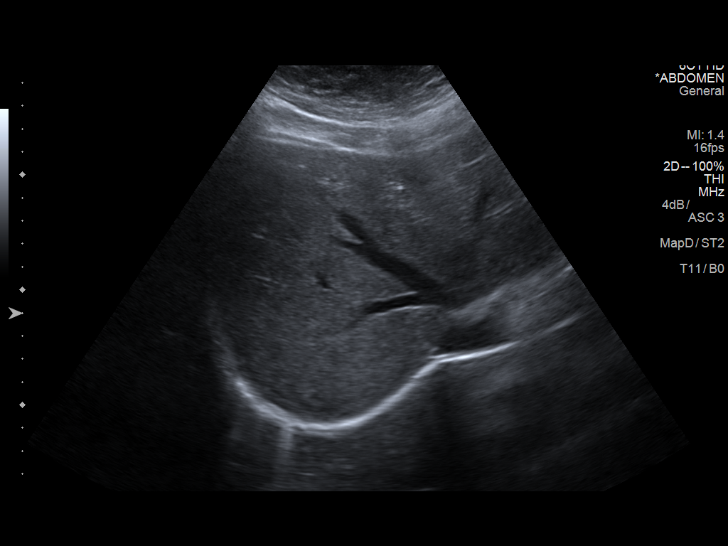
[im 52/125]
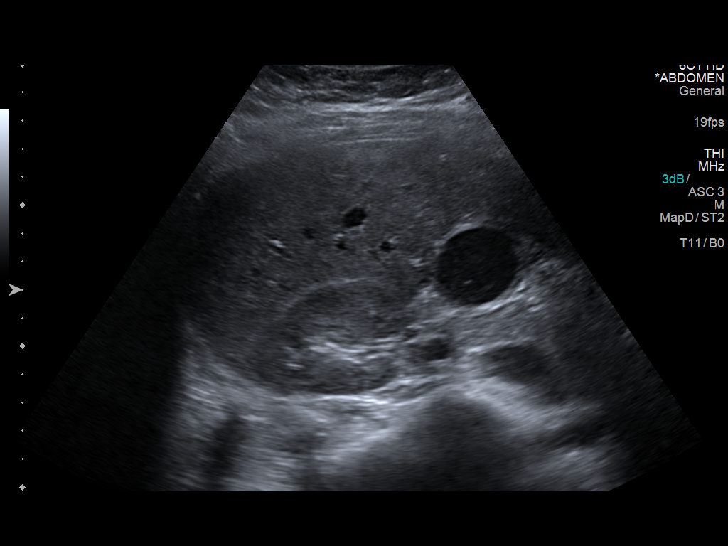
[im 63/125]
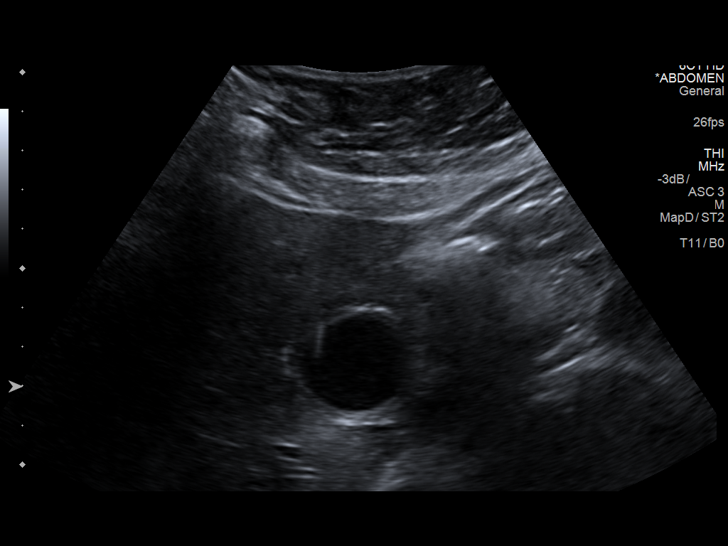
[im 73/125]
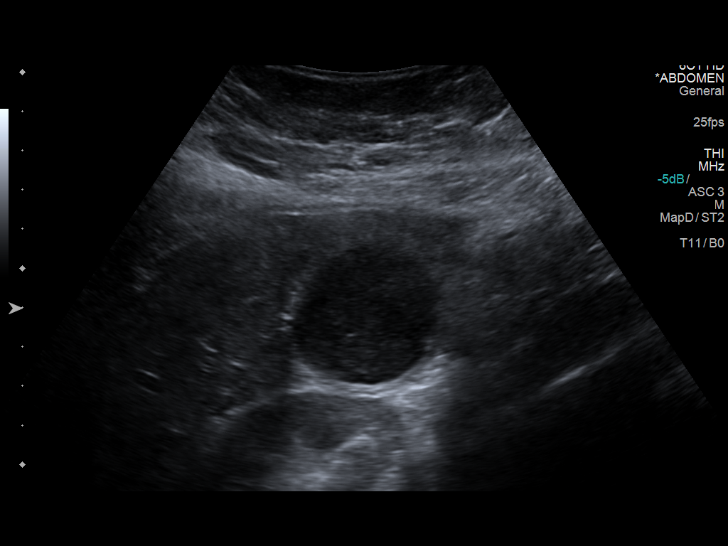
[im 83/125]
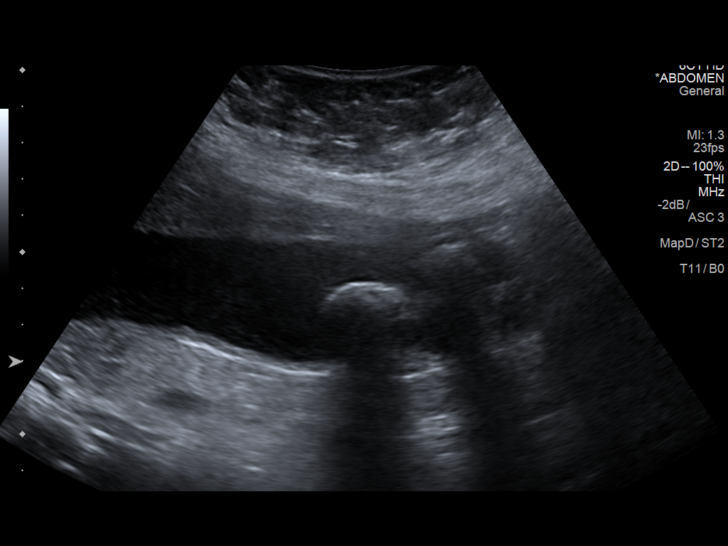
[im 94/125]
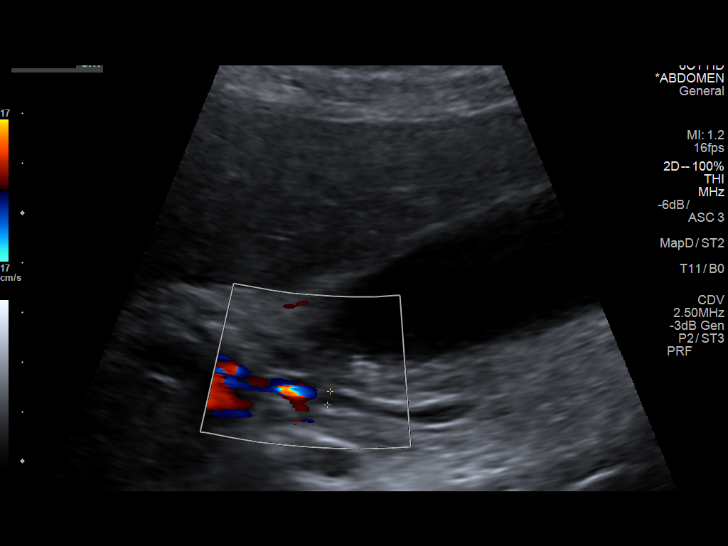
[im 104/125]
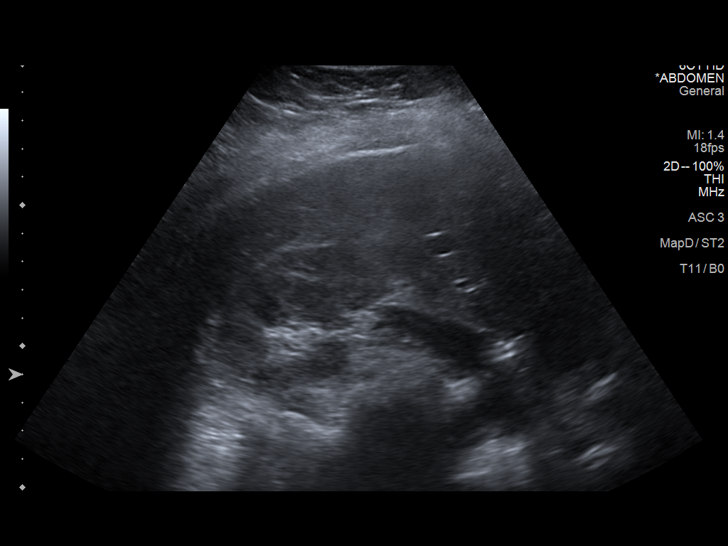
[im 114/125]
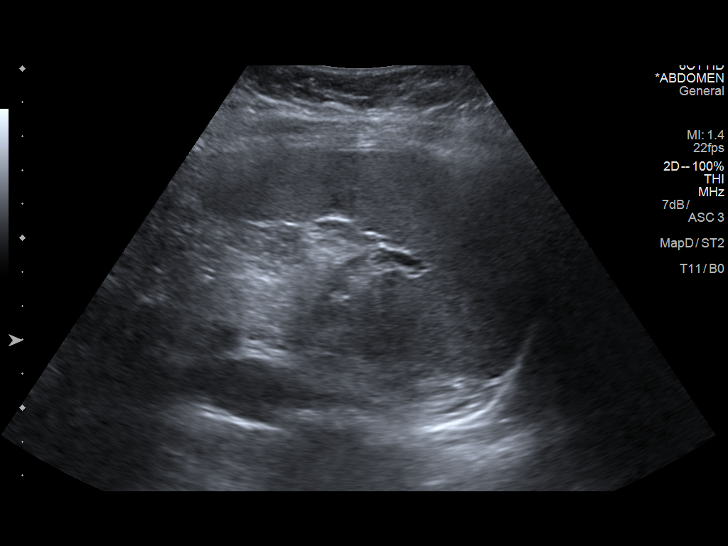
[im 125/125]
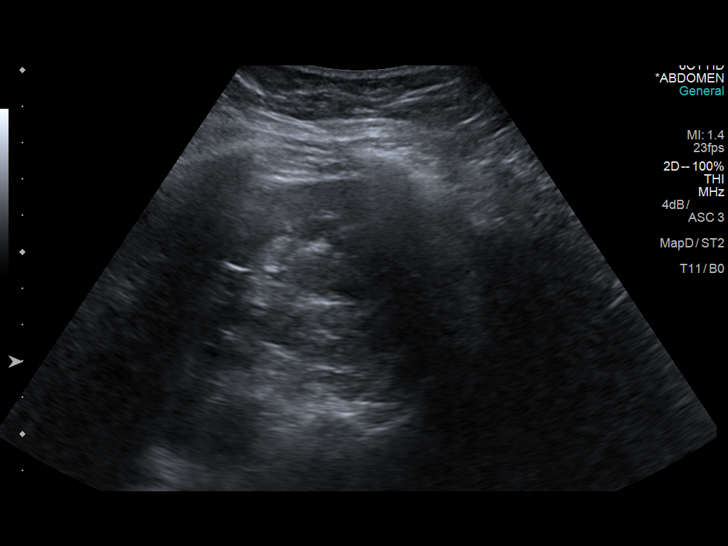

[13 of 25 positions shown; findings below may reference images not displayed]

FINDINGS: Gallbladder: Gallbladder is distended. Within the gallbladder, there
are multiple echogenic foci which move and shadow consistent with
cholelithiasis. Largest gallstone measures 2.6 cm in length. There
is a 1.6 cm calculus adherent in the neck of the gallbladder. There
is gallbladder wall thickening with gallbladder wall edema and
slight pericholecystic fluid. No sonographic Murphy sign noted by
sonographer.

Common bile duct: Diameter: 3 mm. No intrahepatic, common hepatic,
or common bile duct dilatation.

Liver: No focal lesion identified. Within normal limits in
parenchymal echogenicity. Portal vein is patent on color Doppler
imaging with normal direction of blood flow towards the liver.

IVC: No abnormality visualized.

Pancreas: No pancreatic mass or inflammatory focus.

Spleen: Size and appearance within normal limits.

Right Kidney: Length: 11.5 cm. Echogenicity within normal limits. No
mass or hydronephrosis visualized.

Left Kidney: Length: 10.6 cm. Echogenicity within normal limits. No
mass or hydronephrosis visualized.

Abdominal aorta: No aneurysm visualized.

Other findings: No demonstrable ascites.
IMPRESSION: Distended gallbladder with cholelithiasis and gallstone adherent in
the neck. There is gallbladder wall thickening with edematous change
and slight pericholecystic fluid. These are findings felt to be
evident of acute cholecystitis.

Study otherwise unremarkable.

## 2021-12-30 ENCOUNTER — Encounter (HOSPITAL_BASED_OUTPATIENT_CLINIC_OR_DEPARTMENT_OTHER): Payer: Self-pay

## 2021-12-30 ENCOUNTER — Emergency Department (HOSPITAL_BASED_OUTPATIENT_CLINIC_OR_DEPARTMENT_OTHER)
Admission: EM | Admit: 2021-12-30 | Discharge: 2021-12-30 | Disposition: A | Discharge status: Home / Self Care | Payer: Self-pay | Attending: Emergency Medicine | Admitting: Emergency Medicine

## 2021-12-30 ENCOUNTER — Other Ambulatory Visit: Payer: Self-pay

## 2021-12-30 ENCOUNTER — Emergency Department (HOSPITAL_BASED_OUTPATIENT_CLINIC_OR_DEPARTMENT_OTHER): Payer: Self-pay

## 2021-12-30 DIAGNOSIS — J101 Influenza due to other identified influenza virus with other respiratory manifestations: Secondary | ICD-10-CM | POA: Insufficient documentation

## 2021-12-30 DIAGNOSIS — Z20822 Contact with and (suspected) exposure to covid-19: Secondary | ICD-10-CM | POA: Insufficient documentation

## 2021-12-30 LAB — RESP PANEL BY RT-PCR (FLU A&B, COVID) ARPGX2
Influenza A by PCR: POSITIVE — AB
Influenza B by PCR: NEGATIVE
SARS Coronavirus 2 by RT PCR: NEGATIVE

## 2021-12-30 MED ORDER — ACETAMINOPHEN 325 MG PO TABS
650.0000 mg | ORAL_TABLET | Freq: Once | ORAL | Status: AC
Start: 2021-12-30 — End: 2021-12-30
  Administered 2021-12-30: 650 mg via ORAL
  Filled 2021-12-30: qty 2

## 2021-12-30 MED ORDER — ALBUTEROL SULFATE HFA 108 (90 BASE) MCG/ACT IN AERS
2.0000 | INHALATION_SPRAY | RESPIRATORY_TRACT | Status: DC | PRN
  Administered 2021-12-30: 2 via RESPIRATORY_TRACT
  Filled 2021-12-30: qty 6.7

## 2021-12-30 MED ORDER — GUAIFENESIN-CODEINE 100-10 MG/5ML PO SOLN: 5.0000 mL | ORAL | 0 refills | Status: AC | PRN

## 2021-12-30 MED ORDER — AEROCHAMBER Z-STAT PLUS/MEDIUM MISC
1.0000 | Freq: Once | Status: AC
  Administered 2021-12-30: 1
  Filled 2021-12-30: qty 1

## 2021-12-30 NOTE — Discharge Instructions (Addendum)
Alternate tylenol and ibuprofen for fever and body aches.

## 2021-12-30 NOTE — ED Provider Notes (Signed)
[facility] EMERGENCY DEPARTMENT Provider Note   CSN: [id number] Arrival date & time: 12/30/21  0034     History  Chief Complaint  Patient presents with   Cough    Caroline Harrell is a 36 y.o. female.  Pt is a 36 yo bf with no pmhx.  Pt said she developed a "harsh" cough yesterday.  She said she had a very hard time sleeping due to the cough.  Pt said she has not had a fever.  No known sick contacts.      Home Medications Prior to Admission medications   Medication Sig Start Date End Date Taking? Authorizing Provider  guaiFENesin-codeine 100-10 MG/5ML syrup Take 5 mLs by mouth every 4 (four) hours as needed for cough. 12/30/21  Yes Jacalyn Lefevre, MD  acetaminophen (TYLENOL) 500 MG tablet Take 2 tablets (1,000 mg total) by mouth every 6 (six) hours as needed. 04/30/18   Adam Phenix, PA-C  ibuprofen (ADVIL,MOTRIN) 600 MG tablet Take 1 tablet (600 mg total) by mouth every 8 (eight) hours as needed. 04/30/18   Adam Phenix, PA-C  phentermine 37.5 MG capsule Take 37.5 mg by mouth every morning.    [provider]      Allergies    Patient has no known allergies.    Review of Systems   Review of Systems  Respiratory:  Positive for cough.   All other systems reviewed and are negative.  Physical Exam Updated Vital Signs BP (!) 155/97 (BP Location: Right Arm)    Pulse (!) 106    Temp 99.1 F (37.3 C) (Oral)    Resp 18    Ht 5\' 7"  (1.702 m)    Wt 98.9 kg    LMP 11/26/2021 Comment: not sexually active since lmp.   SpO2 99%    BMI 34.14 kg/m  Physical Exam Vitals and nursing note reviewed.  Constitutional:      Appearance: Normal appearance.  HENT:     Head: Normocephalic and atraumatic.     Right Ear: External ear normal.     Left Ear: External ear normal.     Nose: Nose normal.     Mouth/Throat:     Mouth: Mucous membranes are moist.     Pharynx: Oropharynx is clear.  Eyes:     Extraocular Movements: Extraocular movements intact.      Conjunctiva/sclera: Conjunctivae normal.     Pupils: Pupils are equal, round, and reactive to light.  Cardiovascular:     Rate and Rhythm: Normal rate and regular rhythm.     Pulses: Normal pulses.     Heart sounds: Normal heart sounds.  Pulmonary:     Effort: Pulmonary effort is normal.     Breath sounds: Normal breath sounds.  Abdominal:     General: Abdomen is flat. Bowel sounds are normal.     Palpations: Abdomen is soft.  Musculoskeletal:        General: Normal range of motion.     Cervical back: Normal range of motion and neck supple.  Skin:    General: Skin is warm.     Capillary Refill: Capillary refill takes less than 2 seconds.  Neurological:     General: No focal deficit present.     Mental Status: She is alert and oriented to person, place, and time.  Psychiatric:        Mood and Affect: Mood normal.        Behavior: Behavior normal.    ED Results /  Procedures / Treatments   Labs (all labs ordered are listed, but only abnormal results are displayed) Labs Reviewed  RESP PANEL BY RT-PCR (FLU A&B, COVID) ARPGX2 - Abnormal; Notable for the following components:      Result Value   Influenza A by PCR POSITIVE (*)    All other components within normal limits    EKG None  Radiology DG Chest Portable 1 View  Result Date: 12/30/2021 CLINICAL DATA:  Shortness of breath and cough beginning yesterday. EXAM: PORTABLE CHEST 1 VIEW COMPARISON:  08/11/2019 FINDINGS: The heart size and mediastinal contours are within normal limits. Both lungs are clear. The visualized skeletal structures are unremarkable. Presumed hair pin projected over the right neck. IMPRESSION: No active disease. Electronically Signed   By: Paulina Fusi M.D.   On: 12/30/2021 10:08    Procedures Procedures    Medications Ordered in ED Medications  albuterol (VENTOLIN HFA) 108 (90 Base) MCG/ACT inhaler 2 puff (2 puffs Inhalation Given 12/30/21 0951)  aerochamber Z-Stat Plus/medium 1 each (1 each Other  Given 12/30/21 0954)    ED Course/ Medical Decision Making/ A&P                           Medical Decision Making Amount and/or Complexity of Data Reviewed Radiology: ordered.  Risk OTC drugs. Prescription drug management.   Pt is + for the flu.  She is negative for Covid.  CXR is clear. Pt is given an inhaler and spacer here.  We talked about Tamiflu.  She does not have insurance and will have to pay out of pocket.  Since it is minimally effective, she does not want a rx for it.  Pt is stable for d/c.  She is to return if worse.  Caroline Harrell was evaluated in Emergency Department on 12/30/2021 for the symptoms described in the history of present illness. She was evaluated in the context of the global COVID-19 pandemic, which necessitated consideration that the patient might be at risk for infection with the SARS-CoV-2 virus that causes COVID-19. Institutional protocols and algorithms that pertain to the evaluation of patients at risk for COVID-19 are in a state of rapid change based on information released by regulatory bodies including the CDC and federal and state organizations. These policies and algorithms were followed during the patient's care in the ED.         Final Clinical Impression(s) / ED Diagnoses Final diagnoses:  Influenza A    Rx / DC Orders ED Discharge Orders          Ordered    guaiFENesin-codeine 100-10 MG/5ML syrup  Every 4 hours PRN        12/30/21 1054              Jacalyn Lefevre, MD 12/30/21 1057

## 2021-12-30 NOTE — ED Notes (Signed)
Chest xray performed

## 2021-12-30 NOTE — ED Triage Notes (Signed)
First contact with patient. Patient arrived via triage from home with complaints of coughing x yesterday - nonproductive. Patient states she has shortness of breath after coughing - Patient speaking in full sentences. Pt is A&OX 4. Respirations even/unlabored.  Patient updated on plan of care. Will continue to monitor patient.

## 2023-07-07 IMAGING — DX DG CHEST 1V PORT
1 series · 1 of 1 positions shown · non-contrast
Comparison: 08/11/2019

CLINICAL DATA: Shortness of breath and cough beginning yesterday.

EXAM:
PORTABLE CHEST 1 VIEW

[chest ap]
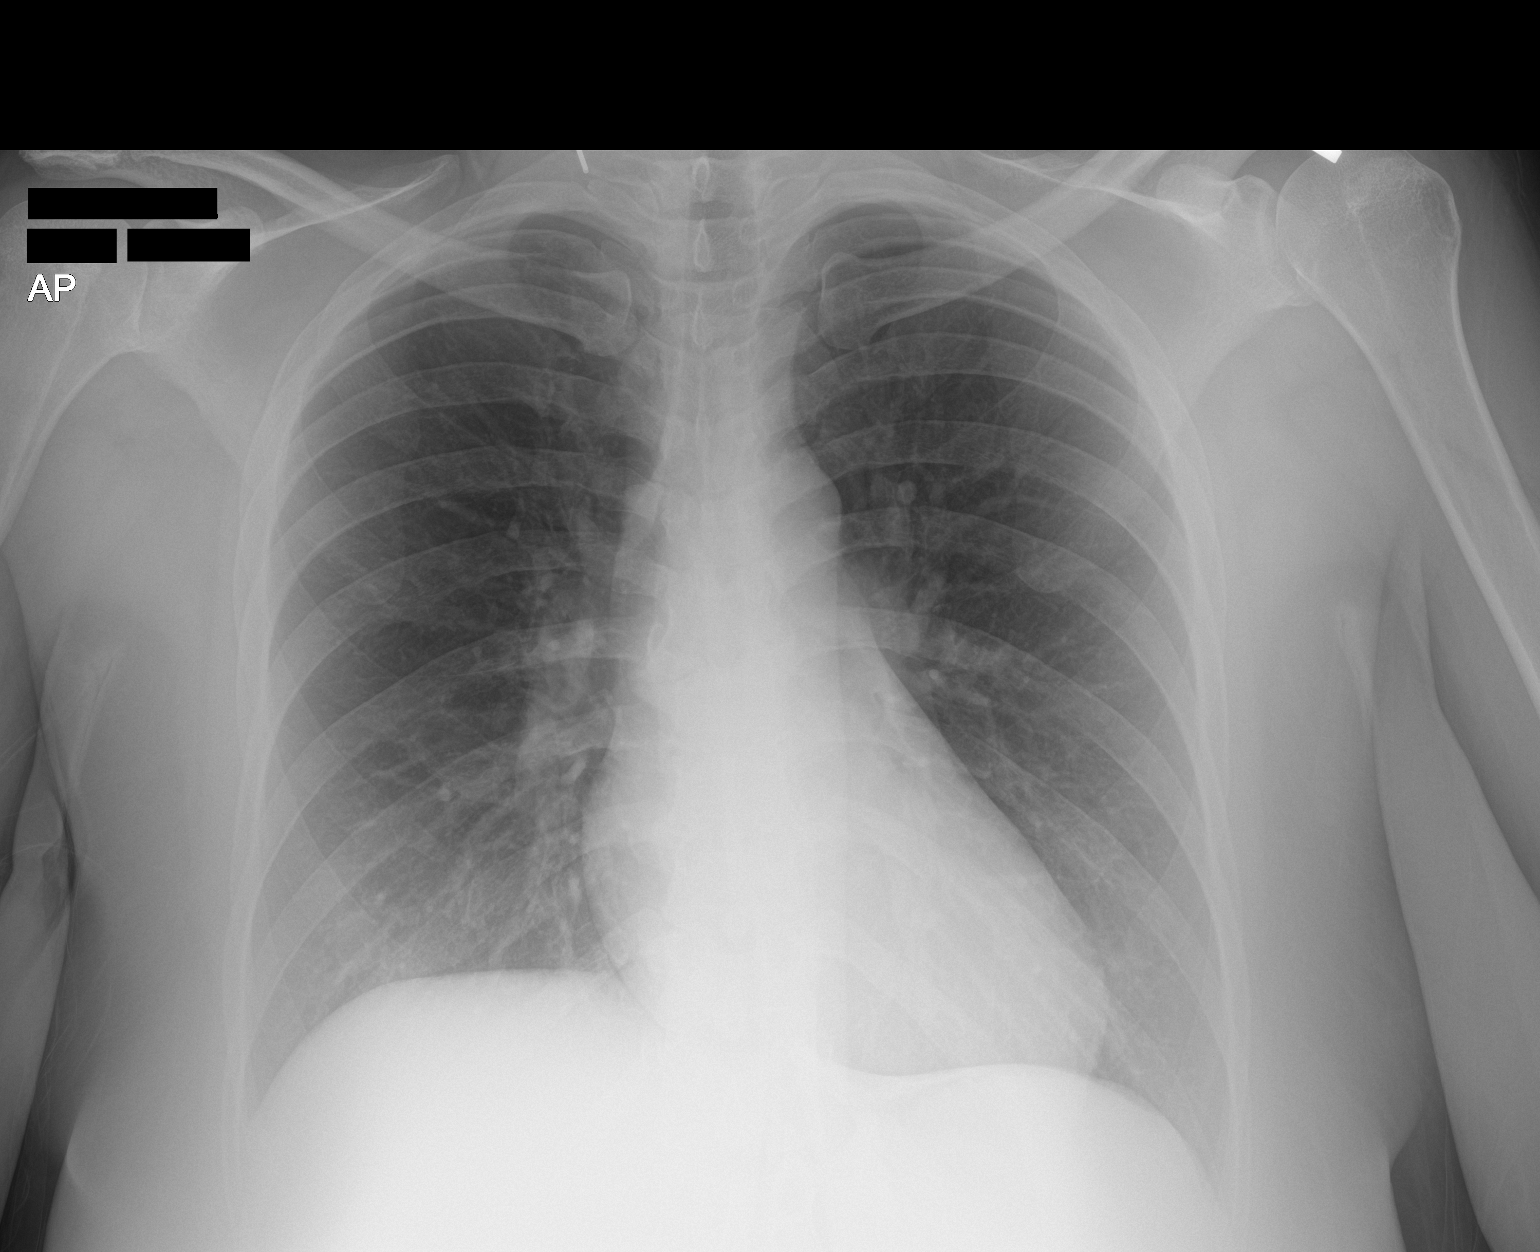

[1 of 1 positions shown; findings below may reference images not displayed]

FINDINGS: The heart size and mediastinal contours are within normal limits.
Both lungs are clear. The visualized skeletal structures are
unremarkable. Presumed hair pin projected over the right neck.
IMPRESSION: No active disease.

## 2023-10-16 ENCOUNTER — Emergency Department (HOSPITAL_BASED_OUTPATIENT_CLINIC_OR_DEPARTMENT_OTHER)
Admission: EM | Admit: 2023-10-16 | Discharge: 2023-10-16 | Disposition: A | Discharge status: Home / Self Care | Payer: No Typology Code available for payment source | Attending: Emergency Medicine | Admitting: Emergency Medicine

## 2023-10-16 ENCOUNTER — Encounter (HOSPITAL_BASED_OUTPATIENT_CLINIC_OR_DEPARTMENT_OTHER): Payer: Self-pay

## 2023-10-16 ENCOUNTER — Other Ambulatory Visit: Payer: Self-pay

## 2023-10-16 ENCOUNTER — Emergency Department (HOSPITAL_BASED_OUTPATIENT_CLINIC_OR_DEPARTMENT_OTHER): Payer: No Typology Code available for payment source

## 2023-10-16 DIAGNOSIS — Y92410 Unspecified street and highway as the place of occurrence of the external cause: Secondary | ICD-10-CM | POA: Diagnosis not present

## 2023-10-16 DIAGNOSIS — M545 Low back pain, unspecified: Secondary | ICD-10-CM | POA: Diagnosis present

## 2023-10-16 LAB — PREGNANCY, URINE: Preg Test, Ur: NEGATIVE

## 2023-10-16 MED ORDER — METHOCARBAMOL 500 MG PO TABS: 500.0000 mg | ORAL_TABLET | Freq: Two times a day (BID) | ORAL | 0 refills | Status: AC | PRN
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
